# Patient Record
Sex: Female | Born: 1985 | Hispanic: No | Marital: Married | Smoking: Never smoker
Health system: Southern US, Community
[De-identification: ages and names within clinical notes are randomized; demographics above are authoritative.]

## PROBLEM LIST (undated history)

## (undated) ENCOUNTER — Inpatient Hospital Stay (HOSPITAL_COMMUNITY): Payer: Self-pay

## (undated) DIAGNOSIS — T7840XA Allergy, unspecified, initial encounter: Secondary | ICD-10-CM

## (undated) DIAGNOSIS — R531 Weakness: Secondary | ICD-10-CM

## (undated) DIAGNOSIS — D649 Anemia, unspecified: Secondary | ICD-10-CM

## (undated) DIAGNOSIS — R87619 Unspecified abnormal cytological findings in specimens from cervix uteri: Secondary | ICD-10-CM

## (undated) HISTORY — DX: Weakness: R53.1

## (undated) HISTORY — DX: Unspecified abnormal cytological findings in specimens from cervix uteri: R87.619

## (undated) HISTORY — PX: NO PAST SURGERIES: SHX2092

## (undated) HISTORY — DX: Anemia, unspecified: D64.9

## (undated) HISTORY — DX: Allergy, unspecified, initial encounter: T78.40XA

---

## 2010-06-27 ENCOUNTER — Encounter: Payer: Self-pay | Admitting: Family Medicine

## 2010-06-27 ENCOUNTER — Ambulatory Visit (INDEPENDENT_AMBULATORY_CARE_PROVIDER_SITE_OTHER): Payer: BC Managed Care – PPO | Admitting: Family Medicine

## 2010-06-27 DIAGNOSIS — R51 Headache: Secondary | ICD-10-CM

## 2010-06-27 DIAGNOSIS — R5383 Other fatigue: Secondary | ICD-10-CM

## 2010-06-27 DIAGNOSIS — Z111 Encounter for screening for respiratory tuberculosis: Secondary | ICD-10-CM

## 2010-06-27 DIAGNOSIS — R5381 Other malaise: Secondary | ICD-10-CM

## 2010-06-27 DIAGNOSIS — R0602 Shortness of breath: Secondary | ICD-10-CM

## 2010-06-27 NOTE — Progress Notes (Signed)
Subjective:    Patient ID: Lindsey Fuller, female    DOB: 06/24/1984, 25 y.o.   MRN: [id number]  HPI  Had HA in the back of her head yesterday. Normally doesn't have HA.  BP as fine yesterday. Was resting when the HA started but she says it was severe.  Felt really weak.  Husband says she looked pale.  Occ has SOB. Hx  of anemia.  Feels like she might faint. SHe does eat meat.  No blood in the urine  Or stool.  Had similar HA about 2 years ago.  No nausea. No light or sounds sensitivity. Skip meals occasionally. Mostly eats junk food.  Feels SOB sometimes.  No cough, fever, or URI sxs. Has felt this way for months. She has not filled hte hydrocodone she was given in the ED.   Review of Systems  Constitutional: Negative for fever, diaphoresis and unexpected weight change.  HENT: Negative for hearing loss, rhinorrhea and tinnitus.   Eyes: Negative for visual disturbance.  Respiratory: Negative for cough and wheezing.   Cardiovascular: Negative for chest pain and palpitations.  Gastrointestinal: Negative for nausea, vomiting, diarrhea and blood in stool.  Genitourinary: Negative for vaginal bleeding, vaginal discharge and difficulty urinating.  Musculoskeletal: Negative for myalgias and arthralgias.  Skin: Negative for rash.  Neurological: Positive for headaches.  Hematological: Negative for adenopathy. Does not bruise/bleed easily.  Psychiatric/Behavioral: Negative for sleep disturbance and dysphoric mood. The patient is not nervous/anxious.    BP 114/73  Pulse 65  Resp 20  Ht 5\' 6"  (1.676 m)  Wt 115 lb (52.164 kg)  BMI 18.56 kg/m2  SpO2 100%  LMP 06/01/2010    Allergies  Allergen Reactions  . Asa Buff (Mag (Aspirin Buffered) Rash    Past Medical History  Diagnosis Date  . Chronic headaches     2 episodes ever  . Weakness     No past surgical history on file.  History   Social History  . Marital Status: Married    Spouse Name: Masiah Lewing    Number of Children: N/A    . Years of Education: BA   Occupational History  . Student    Social History Main Topics  . Smoking status: Never Smoker   . Smokeless tobacco: Not on file  . Alcohol Use: No  . Drug Use: No  . Sexually Active: Yes -- Female partner(s)     runs, goes to the gymn 3 X week, student BS degree   Other Topics Concern  . Not on file   Social History Narrative   2 caffeinated drinks daily.  Exercises regulalry.      No family history on file.  Current outpatient prescriptions:HYDROcodone-acetaminophen (VICODIN) 5-500 MG per tablet, Take 1 tablet by mouth every 6 (six) hours as needed.  , Disp: , Rfl:      Objective:   Physical Exam  Constitutional: She is oriented to person, place, and time. She appears well-developed and well-nourished.  HENT:  Head: Normocephalic and atraumatic.  Right Ear: External ear normal.  Left Ear: External ear normal.  Nose: Nose normal.  Mouth/Throat: Oropharynx is clear and moist.       TMs and canals are clear.   Eyes: Conjunctivae are normal. Pupils are equal, round, and reactive to light.  Neck: Neck supple. No thyromegaly present.  Cardiovascular: Normal rate, regular rhythm and normal heart sounds.   Pulmonary/Chest: Effort normal and breath sounds normal.  Musculoskeletal: She exhibits no edema.  Lymphadenopathy:  She has no cervical adenopathy.  Neurological: She is alert and oriented to person, place, and time. She has normal reflexes. She displays normal reflexes. No cranial nerve deficit. She exhibits normal muscle tone. Coordination normal.       CN 2-12 intact  Skin: Skin is warm and dry.  Psychiatric: She has a normal mood and affect.          Assessment & Plan:  Headache-  She is better today. Discussed eating more regularly and staying hydrated. Also will check labs to eval for anemia since has been anemic in the past.  She does drink daily caffeine and I explained that this can cause HA as well.  I suspect her SOB is likely  from anemia. She has no assoc cough or URI sxs. Will ger records from the ED as well.   She also needs a TB skin test but she has had the BCG vaccine series. She will likely be + and will need a CXR.  I explained this to her and her husband.

## 2010-06-30 ENCOUNTER — Ambulatory Visit: Payer: BC Managed Care – PPO

## 2010-06-30 LAB — CBC WITH DIFFERENTIAL/PLATELET
HCT: 36.7 % (ref 36.0–46.0)
Hemoglobin: 12 g/dL (ref 12.0–15.0)
Lymphocytes Relative: 36 % (ref 12–46)
MCHC: 32.7 g/dL (ref 30.0–36.0)
Monocytes Absolute: 0.4 10*3/uL (ref 0.1–1.0)
Monocytes Relative: 7 % (ref 3–12)
Neutro Abs: 2.9 10*3/uL (ref 1.7–7.7)
WBC: 5.6 10*3/uL (ref 4.0–10.5)

## 2010-06-30 LAB — COMPLETE METABOLIC PANEL WITH GFR
Albumin: 4.1 g/dL (ref 3.5–5.2)
CO2: 23 mEq/L (ref 19–32)
GFR, Est African American: >60 mL/min (ref >60)
GFR, Est Non African American: >60 mL/min (ref >60)
Glucose, Bld: 81 mg/dL (ref 70–99)
Potassium: 3.8 mEq/L (ref 3.5–5.3)
Sodium: 138 mEq/L (ref 135–145)
Total Protein: 6.8 g/dL (ref 6.0–8.3)

## 2010-07-01 ENCOUNTER — Telehealth: Payer: Self-pay | Admitting: Family Medicine

## 2010-07-01 NOTE — Telephone Encounter (Signed)
Call pt; Blood count, metabolic panel and thyroid are normal. No anemia. I do recommend more healthy diet and even consider a MVI.  Also I don't have her TB test results back today.

## 2010-07-01 NOTE — Telephone Encounter (Signed)
Pt advised of test results. Will call and sched her TB test to be placed tomr.

## 2010-07-02 ENCOUNTER — Ambulatory Visit (INDEPENDENT_AMBULATORY_CARE_PROVIDER_SITE_OTHER): Payer: BC Managed Care – PPO | Admitting: Family Medicine

## 2010-07-02 DIAGNOSIS — Z23 Encounter for immunization: Secondary | ICD-10-CM

## 2010-07-04 ENCOUNTER — Telehealth: Payer: Self-pay | Admitting: *Deleted

## 2010-07-04 NOTE — Telephone Encounter (Signed)
TB skin test neg 0mm duration

## 2012-01-22 ENCOUNTER — Ambulatory Visit (INDEPENDENT_AMBULATORY_CARE_PROVIDER_SITE_OTHER): Payer: BC Managed Care – PPO | Admitting: Physician Assistant

## 2012-01-22 ENCOUNTER — Encounter: Payer: Self-pay | Admitting: Physician Assistant

## 2012-01-22 VITALS — BP 97/51 | HR 53 | Ht 66.0 in | Wt 129.0 lb

## 2012-01-22 DIAGNOSIS — Z0000 Encounter for general adult medical examination without abnormal findings: Secondary | ICD-10-CM

## 2012-01-22 DIAGNOSIS — R5381 Other malaise: Secondary | ICD-10-CM

## 2012-01-22 DIAGNOSIS — Z13220 Encounter for screening for lipoid disorders: Secondary | ICD-10-CM

## 2012-01-22 DIAGNOSIS — Z131 Encounter for screening for diabetes mellitus: Secondary | ICD-10-CM

## 2012-01-22 DIAGNOSIS — R5383 Other fatigue: Secondary | ICD-10-CM

## 2012-01-22 NOTE — Patient Instructions (Addendum)
Make sure you are eating 4 servings of dairy a day.   Need to get pap smear.   HPV Vaccine Questions and Answers WHAT IS HUMAN PAPILLOMAVIRUS (HPV)? HPV is a virus that can lead to cervical cancer; vulvar and vaginal cancers; penile cancer; anal cancer and genital warts (warts in the genital areas). More than 1 vaccine is available to help you or your child with protection against HPV. Your caregiver can talk to you about which one might give you the best protection. WHO SHOULD GET THIS VACCINE? The HPV vaccine is most effective when given before the onset of sexual activity.  This vaccine is recommended for girls 71 or 25 years of age. It can be given to girls as young as 25 years old.  HPV vaccine can be given to males, 9 through 25 years of age, to reduce the likelihood of acquiring genital warts.  HPV vaccine can be given to males and females aged 30 through 26 years to prevent anal cancer. HPV vaccine is not generally recommended after age 25, because most individuals have been exposed to the HPV virus by that age. HOW EFFECTIVE IS THIS VACCINE?  The vaccine is generally effective in preventing cervical; vulvar and vaginal cancers; penile cancer; anal cancer and genital warts caused by 4 types of HPV. The vaccine is less effective in those individuals who are already infected with HPV. This vaccine does not treat existing HPV, genital warts, pre-cancers or cancers. WILL SEXUALLY ACTIVE INDIVIDUALS BENEFIT FROM THE VACCINE? Sexually active individuals may still benefit from the vaccine but may get less benefit due to previous HPV exposure. HOW AND WHEN IS THE VACCINE ADMINISTERED? The vaccine is given in a series of 3 injections (shots) over a 6 month period in both males and females. The exact timing depends on which specific vaccine your caregiver recommends for you. IS THE HPV VACCINE SAFE?  The federal government has approved the HPV vaccine as safe and effective. This vaccine was  tested in both males and females in many countries around the world. The most common side effect is soreness at the injection site. Since the drug became approved, there has been some concern about patients passing out after being vaccinated, which has led to a recommendation of a 15 minute waiting period following vaccination. This practice may decrease the small risk of passing out. Additionally there is a rare risk of anaphylaxis (an allergic reaction) to the vaccine and a risk of a blood clot among individuals with specific risk factors for a blood clot. DOES THIS VACCINE CONTAIN THIMEROSAL OR MERCURY? No. There is no thimerosal or mercury in the HPV vaccine. It is made of proteins from the outer coat of the virus (HPV). There is no infectious material in this vaccine. WILL GIRLS/WOMEN WHO HAVE BEEN VACCINATED STILL NEED CERVICAL CANCER SCREENING? Yes. There are 3 reasons why women will still need regular cervical cancer screening. First, the vaccine will NOT provide protection against all types of HPV that cause cervical cancer. Vaccinated women will still be at risk for some cancers. Second, some women may not get all required doses of the vaccine (or they may not get them at the recommended times). Therefore, they may not get the vaccine's full benefits. Third, women may not get the full benefit of the vaccine if they receive it after they have already acquired any of the 4 types of HPV. WILL THE HPV VACCINE BE COVERED BY INSURANCE PLANS? While some insurance companies may cover the  vaccine, others may not. Most large group insurance plans cover the costs of recommended vaccines. WHAT KIND OF GOVERNMENT PROGRAMS MAY BE AVAILABLE TO COVER HPV VACCINE? Federal health programs such as Vaccines for Children [facility]) will cover the HPV vaccine. The [facility] program provides free vaccines to children and adolescents under 89 years of age, who are either uninsured, Medicaid-eligible, American [place] or [place]  Native. There are over 45,000 sites that provide [facility] vaccines including hospital, private and public clinics. The [facility] program also allows children and adolescents to get VFC vaccines through [facility] or [facility] if their private health insurance does not cover the vaccine. Some states also provide free or low-cost vaccines, at public health clinics, to people without health insurance coverage for vaccines. GENITAL HPV: WHY IS HPV IMPORTANT? Genital HPV is the most common virus transmitted through genital contact, most often during vaginal and anal sex. About 40 types of HPV can infect the genital areas of men and women. While most HPV types cause no symptoms and go away on their own, some types can cause cervical cancer in women. These types also cause other less common genital cancers, including cancers of the penis, anus, vagina (birth canal), and vulva (area around the opening of the vagina). Other types of HPV can cause genital warts in men and women. HOW COMMON IS HPV?   At least 50% of sexually active people will get HPV at some time in their lives. HPV is most common in young women and men who are in their late teens and early 92s.  Anyone who has ever had genital contact with another person can get HPV. Both men and women can get it and pass it on to their sex partners without realizing it. IS HPV THE SAME THING AS HIV OR HERPES? HPV is NOT the same as HIV or Herpes (Herpes simplex virus or HSV). While these are all viruses that can be sexually transmitted, HIV and HSV do not cause the same symptoms or health problems as HPV. CAN HPV AND ITS ASSOCIATED DISEASES BE TREATED? There is no treatment for HPV. There are treatments for the health problems that HPV can cause, such as genital warts, cervical cell changes, and cancers of the cervix (lower part of the womb), vulva, vagina and anus.  HOW IS HPV RELATED TO CERVICAL CANCER? Some types of HPV can infect a  woman's cervix and cause the cells to change in an abnormal way. Most of the time, HPV goes away on its own. When HPV is gone, the cervical cells go back to normal. Sometimes, HPV does not go away. Instead, it lingers (persists) and continues to change the cells on a woman's cervix. These cell changes can lead to cancer over time if they are not treated. ARE THERE OTHER WAYS TO PREVENT CERVICAL CANCER? Regular Pap tests and follow-up can prevent most, but not all, cases of cervical cancer. Pap tests can detect cell changes (or pre-cancers) in the cervix before they turn into cancer. Pap tests can also detect most, but not all, cervical cancers at an early, curable stage. Most women diagnosed with cervical cancer have either never had a Pap test, or not had a Pap test in the last 5 years. There is also an HPV DNA test available for use with the Pap test as part of cervical cancer screening. This test may be ordered for women over 30 or for women who get an unclear (borderline) Pap test result. While this  test can tell if a woman has HPV on her cervix, it cannot tell which types of HPV she has. If the HPV DNA test is negative for HPV DNA, then screening may be done every 3 years. If the HPV DNA test is positive for HPV DNA, then screening should be done every 6 to 12 months. OTHER QUESTIONS ABOUT THE HPV VACCINE WHAT HPV TYPES DOES THE VACCINE PROTECT AGAINST? The HPV vaccine protects against the HPV types that cause most (70%) cervical cancers (types 16 and 18), most (78%) anal cancers (types 16 and 18) and the two HPV types that cause most (90%) genital warts (types 6 and 11). WHAT DOES THE VACCINE NOT PROTECT AGAINST?  Because the vaccine does not protect against all types of HPV, it will not prevent all cases of cervical cancer, anal cancer, other genital cancers or genital warts. About 30% of cervical cancers are not prevented with vaccination, so it will be important for women to continue screening for  cervical cancer (regular Pap tests). Also, the vaccine does not prevent about 10% of genital warts nor will it prevent other sexually transmitted infections (STIs), including HIV. Therefore, it will still be important for sexually active adults to practice safe sex to reduce exposure to HPV and other STI's. HOW LONG DOES VACCINE PROTECTION LAST? WILL A BOOSTER SHOT BE NEEDED? So far, studies have followed women for 5 years and found that they are still protected. Currently, additional (booster) doses are not recommended. More research is being done to find out how long protection will last, and if a booster vaccine is needed years later.  WHY IS THE HPV VACCINE RECOMMENDED AT SUCH A YOUNG AGE? Ideally, males and females should get the vaccine before they are sexually active since this vaccine is most effective in individuals who have not yet acquired any of the HPV vaccine types. Individuals who have not been infected with any of the 4 types of HPV will get the full benefits of the vaccine.  SHOULD PREGNANT WOMEN BE VACCINATED? The vaccine is not recommended for pregnant women. There has been limited research looking at vaccine safety for pregnant women and their developing fetus. Studies suggest that the vaccine has not caused health problems during pregnancy, nor has it caused health problems for the infant. Pregnant women should complete their pregnancy before getting the vaccine. If a woman finds out she is pregnant after she has started getting the vaccine series, she should complete her pregnancy before finishing the 3 doses. SHOULD BREASTFEEDING MOTHERS BE VACCINATED? Mothers nursing their babies may get the vaccine because the virus is inactivated and will not harm the mother or baby. WILL INDIVIDUALS BE PROTECTED AGAINST HPV AND RELATED DISEASES, EVEN IF THEY DO NOT GET ALL 3 DOSES? It is not yet known how much protection individuals will get from receiving only 1 or 2 doses of the vaccine. For  this reason, it is very important that individuals get all 3 doses of the vaccine. WILL CHILDREN BE REQUIRED TO BE VACCINATED TO ENTER SCHOOL? There are no federal laws that require children or adolescents to get vaccinated. All school entry laws are state laws so they vary from state to state. To find out what vaccines are needed for children or adolescents to enter school in your state, check with your state health department or board of education. ARE THERE OTHER WAYS TO PREVENT HPV? The only sure way to prevent HPV is to abstain from all sexual activity. Sexually active adults  can reduce their risk by being in a mutually monogamous relationship with someone who has had no other sex partners. But even individuals with only 1 lifetime sex partner can get HPV, if their partner has had a previous partner with HPV. It is unknown how much protection condoms provide against HPV, since areas that are not covered by a condom can be exposed to the virus. However, condoms may reduce the risk of genital warts and cervical cancer. They can also reduce the risk of HIV and some other sexually transmitted infections (STIs), when used consistently and correctly (all the time and the right way). Document Released: 12/29/2004 Document Revised: 03/23/2011 Document Reviewed: 08/24/2008 [facility] Patient Information 2013 [place], [place].

## 2012-01-22 NOTE — Progress Notes (Signed)
Subjective:     Lindsey Fuller is a 27 y.o. female and is here for a comprehensive physical exam. The patient reports problems - Patient does report some fatigue. She met it is mostly around her menstrual cycle. Patient does exercise regularly and tries to maintain a healthy diet.. Patient reports that she would like labs to make sure there is no reason for her having fatigue. She does confirm that her periods are regular and her flow is average. Patient has never had a Pap smear  History   Social History  . Marital Status: Married    Spouse Name: Joleah Kosak    Number of Children: N/A  . Years of Education: BA   Occupational History  . Student    Social History Main Topics  . Smoking status: Never Smoker   . Smokeless tobacco: Not on file  . Alcohol Use: No  . Drug Use: No  . Sexually Active: Yes -- Female partner(s)     Comment: runs, goes to the gymn 3 X week, student BS degree   Other Topics Concern  . Not on file   Social History Narrative   2 caffeinated drinks daily.  Exercises regulalry.     Health Maintenance  Topic Date Due  . Pap Smear  06/23/2005  . Tetanus/tdap  06/24/2006  . Influenza Vaccine  09/13/2011    The following portions of the patient's history were reviewed and updated as appropriate: allergies, current medications, past family history, past medical history, past social history, past surgical history and problem list.  Review of Systems A comprehensive review of systems was negative.   Objective:    BP 97/51  Pulse 53  Ht 5\' 6"  (1.676 m)  Wt 129 lb (58.514 kg)  BMI 20.82 kg/m2  LMP 12/25/2011 General appearance: alert, cooperative and appears stated age Head: Normocephalic, without obvious abnormality, atraumatic Eyes: conjunctivae/corneas clear. PERRL, EOM's intact. Fundi benign. Ears: normal TM's and external ear canals both ears Nose: Nares normal. Septum midline. Mucosa normal. No drainage or sinus tenderness. Throat: lips, mucosa, and  tongue normal; teeth and gums normal Neck: no adenopathy, no carotid bruit, no JVD, supple, symmetrical, trachea midline and thyroid not enlarged, symmetric, no tenderness/mass/nodules Back: symmetric, no curvature. ROM normal. No CVA tenderness. Lungs: clear to auscultation bilaterally Breasts: normal appearance, no masses or tenderness Heart: regular rate and rhythm, S1, S2 normal, no murmur, click, rub or gallop Abdomen: soft, non-tender; bowel sounds normal; no masses,  no organomegaly Extremities: extremities normal, atraumatic, no cyanosis or edema Pulses: 2+ and symmetric Skin: Skin color, texture, turgor normal. No rashes or lesions Lymph nodes: Cervical, supraclavicular, and axillary nodes normal. Neurologic: Grossly normal    Assessment:    Healthy female exam.      Plan:     CPE/fatigue-we'll check lipids, CMP, TSH, vitamin D, vitamin B12, CBC. This would look for any metabolic causes of fatigue. Will call patient with results. Encouraged patient to continue regular exercise and a healthy daily lifestyle. Discuss starting a multivitamin that has calcium in it. A multivitamin along with 2 servings of calcium today with the great way to build bone help. Patient declined flu shot today. She also does not had any documented Tdap today. She is going to review her records and see if she can find when her last tdap was and if she needs one she will call office. I did discuss with patient the need for a Pap smear. She is aware and states that she will  either schedule with OB or this office. See After Visit Summary for Counseling Recommendations

## 2012-01-28 LAB — LIPID PANEL
Cholesterol: 128 mg/dL (ref 0–200)
HDL: 47 mg/dL
LDL Cholesterol: 70 mg/dL (ref 0–99)
Total CHOL/HDL Ratio: 2.7 ratio
Triglycerides: 54 mg/dL
VLDL: 11 mg/dL (ref 0–40)

## 2012-01-28 LAB — COMPREHENSIVE METABOLIC PANEL WITH GFR
ALT: 11 U/L (ref 0–35)
AST: 17 U/L (ref 0–37)
Albumin: 3.8 g/dL (ref 3.5–5.2)
Alkaline Phosphatase: 33 U/L — ABNORMAL LOW (ref 39–117)
BUN: 9 mg/dL (ref 6–23)
CO2: 27 meq/L (ref 19–32)
Calcium: 8.8 mg/dL (ref 8.4–10.5)
Chloride: 107 meq/L (ref 96–112)
Creat: 0.48 mg/dL — ABNORMAL LOW (ref 0.50–1.10)
Glucose, Bld: 90 mg/dL (ref 70–99)
Potassium: 3.7 meq/L (ref 3.5–5.3)
Sodium: 139 meq/L (ref 135–145)
Total Bilirubin: 0.5 mg/dL (ref 0.3–1.2)
Total Protein: 6.5 g/dL (ref 6.0–8.3)

## 2012-01-28 LAB — CBC WITH DIFFERENTIAL/PLATELET
Basophils Relative: 1 % (ref 0–1)
Eosinophils Absolute: 0.3 10*3/uL (ref 0.0–0.7)
Eosinophils Relative: 7 % — ABNORMAL HIGH (ref 0–5)
Hemoglobin: 11.7 g/dL — ABNORMAL LOW (ref 12.0–15.0)
MCH: 28 pg (ref 26.0–34.0)
MCHC: 32.8 g/dL (ref 30.0–36.0)
MCV: 85.4 fL (ref 78.0–100.0)
Monocytes Absolute: 0.4 10*3/uL (ref 0.1–1.0)
Monocytes Relative: 8 % (ref 3–12)
Neutrophils Relative %: 49 % (ref 43–77)

## 2012-01-28 LAB — VITAMIN B12: Vitamin B-12: 617 pg/mL (ref 211–911)

## 2012-01-29 ENCOUNTER — Telehealth: Payer: Self-pay | Admitting: Physician Assistant

## 2012-01-29 LAB — VITAMIN D 25 HYDROXY (VIT D DEFICIENCY, FRACTURES): Vit D, 25-Hydroxy: 33 ng/mL (ref 30–89)

## 2012-01-29 NOTE — Telephone Encounter (Signed)
Pt.notified

## 2012-01-29 NOTE — Telephone Encounter (Signed)
Call pt: Received records she did have Tetanus in 2007. She does need a Tdap booster that has pertussis vaccine in it. She could come in anytime to get booster.

## 2012-04-06 ENCOUNTER — Ambulatory Visit (INDEPENDENT_AMBULATORY_CARE_PROVIDER_SITE_OTHER): Payer: BC Managed Care – PPO | Admitting: Physician Assistant

## 2012-04-06 ENCOUNTER — Encounter: Payer: Self-pay | Admitting: Physician Assistant

## 2012-04-06 VITALS — BP 119/73 | HR 111 | Wt 128.0 lb

## 2012-04-06 DIAGNOSIS — R0981 Nasal congestion: Secondary | ICD-10-CM | POA: Insufficient documentation

## 2012-04-06 DIAGNOSIS — J302 Other seasonal allergic rhinitis: Secondary | ICD-10-CM | POA: Insufficient documentation

## 2012-04-06 DIAGNOSIS — R05 Cough: Secondary | ICD-10-CM

## 2012-04-06 DIAGNOSIS — R0602 Shortness of breath: Secondary | ICD-10-CM

## 2012-04-06 DIAGNOSIS — J3489 Other specified disorders of nose and nasal sinuses: Secondary | ICD-10-CM

## 2012-04-06 DIAGNOSIS — R059 Cough, unspecified: Secondary | ICD-10-CM

## 2012-04-06 DIAGNOSIS — H04209 Unspecified epiphora, unspecified lacrimal gland: Secondary | ICD-10-CM

## 2012-04-06 DIAGNOSIS — J309 Allergic rhinitis, unspecified: Secondary | ICD-10-CM

## 2012-04-06 MED ORDER — FLUTICASONE PROPIONATE 50 MCG/ACT NA SUSP: 2.0000 | Freq: Every day | NASAL | Status: DC

## 2012-04-06 MED ORDER — ALBUTEROL SULFATE HFA 108 (90 BASE) MCG/ACT IN AERS: 2.0000 | INHALATION_SPRAY | Freq: Four times a day (QID) | RESPIRATORY_TRACT | Status: DC | PRN

## 2012-04-06 MED ORDER — METHYLPREDNISOLONE SODIUM SUCC 125 MG IJ SOLR
125.0000 mg | Freq: Once | INTRAMUSCULAR | Status: AC
  Administered 2012-04-06: 125 mg via INTRAMUSCULAR

## 2012-04-06 NOTE — Patient Instructions (Addendum)
Start flonase 2 sprays daily can decrease to 1 spray when controlled. Start allegra/zyrtec daily.  Can use sudafed/decongestant for 3 days ish when things get worse.   Sending albuterol to use as needed if start coughing/SOB.   Will refer to allergist.

## 2012-04-06 NOTE — Progress Notes (Signed)
  Subjective:    Patient ID: Lindsey Fuller, female    DOB: 1985-04-01, 27 y.o.   MRN: [id number]  HPI Patient presents to the clinic with trouble breathing. Denies wheezing or history of asthma. She does have seasonal allergies and has not been taking any allergy medication. For last 3 days have been very congested and feels like she cannot take a deep breath. She previously had allergy shots where she lived before because her allergies where so bad. She denies any CP but has had some chest tightness.she has had a dry cough at night and when she attemps to exert herself. Symptoms are worse at night. Denies any fever, chills, ST, ear pain or sinus pressure. Her eyes are very watery.     Review of Systems     Objective:   Physical Exam  Constitutional: She appears well-developed and well-nourished.  HENT:  Head: Normocephalic and atraumatic.  Right Ear: External ear normal.  Left Ear: External ear normal.  Mouth/Throat: Oropharynx is clear and moist. No oropharyngeal exudate.  TM's clear.   Turbinates red and swollen.  Eyes:  Bilateral conjunctivia injected with watery discharge.  Neck: Normal range of motion. Neck supple.  Cardiovascular: Regular rhythm and normal heart sounds.   Tachycardia at 111.  Pulmonary/Chest: Effort normal and breath sounds normal. She has no wheezes.  Lymphadenopathy:    She has no cervical adenopathy.  Skin: Skin is warm and dry.  Psychiatric: She has a normal mood and affect. Her behavior is normal.          Assessment & Plan:  Seasonal allegies/nasal congestion/allergic conjunctivits/cough/chest tightness- peak flow done and in the green. Suspect allergies as the culprit. Shot of solumedrol was given 125mg  today.Sent flonase to start daily along with daily anti-histamine added back to regimen. Did also give albuterol to use at bedtime/SOB/Cough or when peak flow gets' in yellow range. How to use inhaler was discussed. Will also send to allergist to  see if candidate for shots again. Use OTC allergic eye drops and call if not improving. If noticing albuterol is helping and using daily then need to follow up to discuss other treatment. Use decongestant for a couple of days if need it in between daily anti-histamine.

## 2012-12-14 ENCOUNTER — Ambulatory Visit (INDEPENDENT_AMBULATORY_CARE_PROVIDER_SITE_OTHER): Payer: BC Managed Care – PPO | Admitting: Obstetrics & Gynecology

## 2012-12-14 ENCOUNTER — Encounter: Payer: Self-pay | Admitting: Obstetrics & Gynecology

## 2012-12-14 VITALS — BP 128/73 | HR 99 | Resp 16 | Ht 66.0 in | Wt 128.0 lb

## 2012-12-14 DIAGNOSIS — Z124 Encounter for screening for malignant neoplasm of cervix: Secondary | ICD-10-CM

## 2012-12-14 DIAGNOSIS — Z113 Encounter for screening for infections with a predominantly sexual mode of transmission: Secondary | ICD-10-CM

## 2012-12-14 DIAGNOSIS — Z0000 Encounter for general adult medical examination without abnormal findings: Secondary | ICD-10-CM

## 2012-12-14 DIAGNOSIS — Z01419 Encounter for gynecological examination (general) (routine) without abnormal findings: Secondary | ICD-10-CM

## 2012-12-14 DIAGNOSIS — Z1151 Encounter for screening for human papillomavirus (HPV): Secondary | ICD-10-CM

## 2012-12-14 NOTE — Progress Notes (Signed)
Subjective:    Lindsey Fuller is a 27 y.o. female who presents for an annual exam. The patient has no complaints today.She does have heavyish periods.  The patient is sexually active. GYN screening history: no prior history of gyn screening tests. The patient wears seatbelts: yes. The patient participates in regular exercise: yes. (treadmill)  Has the patient ever been transfused or tattooed?: no. The patient reports that there is not domestic violence in her life.   Menstrual History: OB History   Grav Para Term Preterm Abortions TAB SAB Ect Mult Living   0 0 0 0 0 0 0 0 0 0       Menarche age: 74 Coitarche: 51   Patient's last menstrual period was 11/20/2012.    The following portions of the patient's history were reviewed and updated as appropriate: allergies, current medications, past family history, past medical history, past social history, past surgical history and problem list.  Review of Systems A comprehensive review of systems was negative.  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)  Married since 2011, husband is 39 yo Using condoms   Objective:    BP 128/73  Pulse 99  Resp 16  Ht 5\' 6"  (1.676 m)  Wt 128 lb (58.06 kg)  BMI 20.67 kg/m2  LMP 11/20/2012  General Appearance:    Alert, cooperative, no distress, appears stated age  Head:    Normocephalic, without obvious abnormality, atraumatic  Eyes:    PERRL, conjunctiva/corneas clear, EOM's intact, fundi    benign, both eyes  Ears:    Normal TM's and external ear canals, both ears  Nose:   Nares normal, septum midline, mucosa normal, no drainage    or sinus tenderness  Throat:   Lips, mucosa, and tongue normal; teeth and gums normal  Neck:   Supple, symmetrical, trachea midline, no adenopathy;    thyroid:  no enlargement/tenderness/nodules; no carotid   bruit or JVD  Back:     Symmetric, no curvature, ROM normal, no CVA tenderness   Lungs:     Clear to auscultation bilaterally, respirations unlabored  Chest Wall:    No tenderness or deformity   Heart:    Regular rate and rhythm, S1 and S2 normal, no murmur, rub   or gallop  Breast Exam:    No tenderness, masses, or nipple abnormality  Abdomen:     Soft, non-tender, bowel sounds active all four quadrants,    no masses, no organomegaly  Genitalia:    Normal female without lesion, discharge or tenderness, NSSR, minimal mobility, normal adnexal exam     Extremities:   Extremities normal, atraumatic, no cyanosis or edema  Pulses:   2+ and symmetric all extremities  Skin:   Skin color, texture, turgor normal, no rashes or lesions  Lymph nodes:   Cervical, supraclavicular, and axillary nodes normal  Neurologic:   CNII-XII intact, normal strength, sensation and reflexes    throughout  .    Assessment:    Healthy female exam.    Plan:     Breast self exam technique reviewed and patient encouraged to perform self-exam monthly. Chlamydia specimen. GC specimen. Thin prep Pap smear.  I recommended Gardasil and gave her a handout

## 2012-12-21 ENCOUNTER — Telehealth: Payer: Self-pay | Admitting: *Deleted

## 2012-12-21 ENCOUNTER — Encounter: Payer: Self-pay | Admitting: *Deleted

## 2012-12-21 NOTE — Telephone Encounter (Signed)
Pt notified of ASCUS pap with HPV.  She is scheduled for Colposcopy 12/28/12 with Dr Marice Potter.

## 2012-12-28 ENCOUNTER — Encounter: Payer: Self-pay | Admitting: Obstetrics & Gynecology

## 2012-12-28 ENCOUNTER — Ambulatory Visit (INDEPENDENT_AMBULATORY_CARE_PROVIDER_SITE_OTHER): Payer: BC Managed Care – PPO | Admitting: Obstetrics & Gynecology

## 2012-12-28 VITALS — BP 118/69 | HR 80 | Resp 16 | Ht 67.0 in | Wt 131.0 lb

## 2012-12-28 DIAGNOSIS — IMO0002 Reserved for concepts with insufficient information to code with codable children: Secondary | ICD-10-CM

## 2012-12-28 DIAGNOSIS — Z01812 Encounter for preprocedural laboratory examination: Secondary | ICD-10-CM

## 2012-12-28 DIAGNOSIS — R87610 Atypical squamous cells of undetermined significance on cytologic smear of cervix (ASC-US): Secondary | ICD-10-CM

## 2012-12-28 DIAGNOSIS — Z23 Encounter for immunization: Secondary | ICD-10-CM

## 2012-12-28 NOTE — Progress Notes (Signed)
   Subjective:    Patient ID: Lindsey Fuller, female    DOB: April 17, 1985, 27 y.o.   MRN: [id number]  HPI 27 yo young lady here today for a colposcopy because of a ASCUS, +HR HPV pap last month. She has decided to start the Gardasil series today.   Review of Systems     Objective:   Physical Exam  UPT negative, consent signed, time out done Cervix prepped with acetic acid. Transformation zone seen in its entirety. Colpo adequate. Changes c/w LGSIL seen 5 o'clock position at the os (acetowhite changes) ECC obtained. She tolerated the procedure well.        Assessment & Plan:  ACUS +HPV pap- await ECC Start Gardasil today RTC 2 months for Gardasil #2

## 2013-01-02 ENCOUNTER — Telehealth: Payer: Self-pay | Admitting: *Deleted

## 2013-01-02 NOTE — Telephone Encounter (Signed)
Pt notified of neg ECC and will repeat pap in 1 year with co-testing.

## 2013-02-28 ENCOUNTER — Ambulatory Visit: Payer: BC Managed Care – PPO

## 2013-03-01 ENCOUNTER — Ambulatory Visit (INDEPENDENT_AMBULATORY_CARE_PROVIDER_SITE_OTHER): Payer: BC Managed Care – PPO | Admitting: *Deleted

## 2013-03-01 DIAGNOSIS — Z23 Encounter for immunization: Secondary | ICD-10-CM

## 2013-06-29 ENCOUNTER — Ambulatory Visit (INDEPENDENT_AMBULATORY_CARE_PROVIDER_SITE_OTHER): Payer: BC Managed Care – PPO | Admitting: *Deleted

## 2013-06-29 DIAGNOSIS — Z3200 Encounter for pregnancy test, result unknown: Secondary | ICD-10-CM

## 2013-06-29 DIAGNOSIS — Z3201 Encounter for pregnancy test, result positive: Secondary | ICD-10-CM

## 2013-06-29 LAB — POCT URINE PREGNANCY: Preg Test, Ur: POSITIVE

## 2013-06-29 NOTE — Progress Notes (Signed)
Pt here for 3rd Gardasil but thinks she maybe pregnant.  LMP 06/02/13 and has had unprotected intercourse.  UPT today is positive.  Gardasil deferred and NOB appt made.  Pt is currently on PNV.

## 2013-07-24 ENCOUNTER — Encounter: Payer: Self-pay | Admitting: Advanced Practice Midwife

## 2013-07-24 ENCOUNTER — Ambulatory Visit (INDEPENDENT_AMBULATORY_CARE_PROVIDER_SITE_OTHER): Payer: BC Managed Care – PPO | Admitting: Advanced Practice Midwife

## 2013-07-24 VITALS — BP 121/68 | HR 76 | Wt 131.0 lb

## 2013-07-24 DIAGNOSIS — R87810 Cervical high risk human papillomavirus (HPV) DNA test positive: Secondary | ICD-10-CM

## 2013-07-24 DIAGNOSIS — Z3491 Encounter for supervision of normal pregnancy, unspecified, first trimester: Secondary | ICD-10-CM | POA: Insufficient documentation

## 2013-07-24 DIAGNOSIS — R87610 Atypical squamous cells of undetermined significance on cytologic smear of cervix (ASC-US): Secondary | ICD-10-CM | POA: Insufficient documentation

## 2013-07-24 DIAGNOSIS — Z3401 Encounter for supervision of normal first pregnancy, first trimester: Secondary | ICD-10-CM

## 2013-07-24 DIAGNOSIS — Z3400 Encounter for supervision of normal first pregnancy, unspecified trimester: Secondary | ICD-10-CM

## 2013-07-24 NOTE — Patient Instructions (Signed)
Tums Pepcid  Pregnancy - First Trimester During sexual intercourse, millions of sperm go into the vagina. Only 1 sperm will penetrate and fertilize the female egg while it is in the Fallopian tube. One week later, the fertilized egg implants into the wall of the uterus. An embryo begins to develop into a baby. At 6 to 8 weeks, the eyes and face are formed and the heartbeat can be seen on ultrasound. At the end of 12 weeks (first trimester), all the baby's organs are formed. Now that you are pregnant, you will want to do everything you can to have a healthy baby. Two of the most important things are to get good prenatal care and follow your caregiver's instructions. Prenatal care is all the medical care you receive before the baby's birth. It is given to prevent, find, and treat problems during the pregnancy and childbirth. PRENATAL EXAMS  During prenatal visits, your weight, blood pressure, and urine are checked. This is done to make sure you are healthy and progressing normally during the pregnancy.  A pregnant woman should gain 25 to 35 pounds during the pregnancy. However, if you are overweight or underweight, your caregiver will advise you regarding your weight.  Your caregiver will ask and answer questions for you.  Blood work, cervical cultures, other necessary tests, and a Pap test are done during your prenatal exams. These tests are done to check on your health and the probable health of your baby. Tests are strongly recommended and done for HIV with your permission. This is the virus that causes AIDS. These tests are done because medicines can be given to help prevent your baby from being born with this infection should you have been infected without knowing it. Blood work is also used to find out your blood type, previous infections, and follow your blood levels (hemoglobin).  Low hemoglobin (anemia) is common during pregnancy. Iron and vitamins are given to help prevent this. Later in the  pregnancy, blood tests for diabetes will be done along with any other tests if any problems develop.  You may need other tests to make sure you and the baby are doing well. CHANGES DURING THE FIRST TRIMESTER  Your body goes through many changes during pregnancy. They vary from person to person. Talk to your caregiver about changes you notice and are concerned about. Changes can include:  Your menstrual period stops.  The egg and sperm carry the genes that determine what you look like. Genes from you and your partner are forming a baby. The female genes determine whether the baby is a boy or a girl.  Your body increases in girth and you may feel bloated.  Feeling sick to your stomach (nauseous) and throwing up (vomiting). If the vomiting is uncontrollable, call your caregiver.  Your breasts will begin to enlarge and become tender.  Your nipples may stick out more and become darker.  The need to urinate more. Painful urination may mean you have a bladder infection.  Tiring easily.  Loss of appetite.  Cravings for certain kinds of food.  At first, you may gain or lose a couple of pounds.  You may have changes in your emotions from day to day (excited to be pregnant or concerned something may go wrong with the pregnancy and baby).  You may have more vivid and strange dreams. HOME CARE INSTRUCTIONS   It is very important to avoid all smoking, alcohol and non-prescribed drugs during your pregnancy. These affect the formation and growth  of the baby. Avoid chemicals while pregnant to ensure the delivery of a healthy infant.  Start your prenatal visits by the 12th week of pregnancy. They are usually scheduled monthly at first, then more often in the last 2 months before delivery. Keep your caregiver's appointments. Follow your caregiver's instructions regarding medicine use, blood and lab tests, exercise, and diet.  During pregnancy, you are providing food for you and your baby. Eat  regular, well-balanced meals. Choose foods such as meat, fish, milk and other low fat dairy products, vegetables, fruits, and whole-grain breads and cereals. Your caregiver will tell you of the ideal weight gain.  You can help morning sickness by keeping soda crackers at the bedside. Eat a couple before arising in the morning. You may want to use the crackers without salt on them.  Eating 4 to 5 small meals rather than 3 large meals a day also may help the nausea and vomiting.  Drinking liquids between meals instead of during meals also seems to help nausea and vomiting.  A physical sexual relationship may be continued throughout pregnancy if there are no other problems. Problems may be early (premature) leaking of amniotic fluid from the membranes, vaginal bleeding, or belly (abdominal) pain.  Exercise regularly if there are no restrictions. Check with your caregiver or physical therapist if you are unsure of the safety of some of your exercises. Greater weight gain will occur in the last 2 trimesters of pregnancy. Exercising will help:  Control your weight.  Keep you in shape.  Prepare you for labor and delivery.  Help you lose your pregnancy weight after you deliver your baby.  Wear a good support or jogging bra for breast tenderness during pregnancy. This may help if worn during sleep too.  Ask when prenatal classes are available. Begin classes when they are offered.  Do not use hot tubs, steam rooms, or saunas.  Wear your seat belt when driving. This protects you and your baby if you are in an accident.  Avoid raw meat, uncooked cheese, cat litter boxes, and soil used by cats throughout the pregnancy. These carry germs that can cause birth defects in the baby.  The first trimester is a good time to visit your dentist for your dental health. Getting your teeth cleaned is okay. Use a softer toothbrush and brush gently during pregnancy.  Ask for help if you have financial,  counseling, or nutritional needs during pregnancy. Your caregiver will be able to offer counseling for these needs as well as refer you for other special needs.  Do not take any medicines or herbs unless told by your caregiver.  Inform your caregiver if there is any mental or physical domestic violence.  Make a list of emergency phone numbers of family, friends, hospital, and police and fire departments.  Write down your questions. Take them to your prenatal visit.  Do not douche.  Do not cross your legs.  If you have to stand for long periods of time, rotate you feet or take small steps in a circle.  You may have more vaginal secretions that may require a sanitary pad. Do not use tampons or scented sanitary pads. MEDICINES AND DRUG USE IN PREGNANCY  Take prenatal vitamins as directed. The vitamin should contain 1 milligram of folic acid. Keep all vitamins out of reach of children. Only a couple vitamins or tablets containing iron may be fatal to a baby or young child when ingested.  Avoid use of all medicines, including herbs,  over-the-counter medicines, not prescribed or suggested by your caregiver. Only take over-the-counter or prescription medicines for pain, discomfort, or fever as directed by your caregiver. Do not use aspirin, ibuprofen, or naproxen unless directed by your caregiver.  Let your caregiver also know about herbs you may be using.  Alcohol is related to a number of birth defects. This includes fetal alcohol syndrome. All alcohol, in any form, should be avoided completely. Smoking will cause low birth rate and premature babies.  Street or illegal drugs are very harmful to the baby. They are absolutely forbidden. A baby born to an addicted mother will be addicted at birth. The baby will go through the same withdrawal an adult does.  Let your caregiver know about any medicines that you have to take and for what reason you take them. SEEK MEDICAL CARE IF:  You have any  concerns or worries during your pregnancy. It is better to call with your questions if you feel they cannot wait, rather than worry about them. SEEK IMMEDIATE MEDICAL CARE IF:   An unexplained oral temperature above 102 F (38.9 C) develops, or as your caregiver suggests.  You have leaking of fluid from the vagina (birth canal). If leaking membranes are suspected, take your temperature and inform your caregiver of this when you call.  There is vaginal spotting or bleeding. Notify your caregiver of the amount and how many pads are used.  You develop a bad smelling vaginal discharge with a change in the color.  You continue to feel sick to your stomach (nauseated) and have no relief from remedies suggested. You vomit blood or coffee ground-like materials.  You lose more than 2 pounds of weight in 1 week.  You gain more than 2 pounds of weight in 1 week and you notice swelling of your face, hands, feet, or legs.  You gain 5 pounds or more in 1 week (even if you do not have swelling of your hands, face, legs, or feet).  You get exposed to [place] measles and have never had them.  You are exposed to fifth disease or chickenpox.  You develop belly (abdominal) pain. Round ligament discomfort is a common non-cancerous (benign) cause of abdominal pain in pregnancy. Your caregiver still must evaluate this.  You develop headache, fever, diarrhea, pain with urination, or shortness of breath.  You fall or are in a car accident or have any kind of trauma.  There is mental or physical violence in your home. Document Released: 12/23/2000 Document Revised: 09/23/2011 Document Reviewed: 11/08/2012 [facility] Patient Information 2015 [place], [place]. This information is not intended to replace advice given to you by your health care provider. Make sure you discuss any questions you have with your health care provider.

## 2013-07-24 NOTE — Progress Notes (Signed)
Bedside U/S showed IUP with CRL 15.2 mm Gest age 537w2 d and FHT 162 bpm

## 2013-07-24 NOTE — Progress Notes (Signed)
   Subjective:    Lindsey Fuller is a G1P0000 6812w2d being seen today for her first obstetrical visit.  Her obstetrical history is significant for nothing. Patient is undecided about breast feeding. Pregnancy history fully reviewed. Abnormal Pap 12/2012. ASCUS pos HR HPV. Repeat 1 year w/ co-testing per Dr. Marice Potterove.   Patient reports heartburn, nausea, vomiting and 1x per day. Taking B6 which has been helping.  Filed Vitals:   07/24/13 1058  BP: 121/68  Pulse: 76  Weight: 131 lb (59.421 kg)    HISTORY: OB History  Gravida Para Term Preterm AB SAB TAB Ectopic Multiple Living  1 0 0 0 0 0 0 0 0 0     # Outcome Date GA Lbr Len/2nd Weight Sex Delivery Anes PTL Lv  1 CUR              Past Medical History  Diagnosis Date  . Weakness   . Anemia   . Allergy   . Abnormal Pap smear of cervix    History reviewed. No pertinent past surgical history. History reviewed. No pertinent family history.   Exam  Informal BS US shows 7.2 week live SIUP.   Uterus:     Pelvic Exam:    Perineum: deferred   Vulva: deferred                      System: Breast:  normal appearance, no masses or tenderness, No nipple retraction or dimpling   Skin: normal coloration and turgor, no rashes    Neurologic: oriented, normal mood   Extremities: No edema, Neg Homan's   HEENT sclera clear, anicteric   Mouth/Teeth mucous membranes moist, pharynx normal without lesions and dental hygiene good   Neck Supple. No masses. Thyroid normal   Cardiovascular: regular rate and rhythm, no murmurs or gallops   Respiratory:  appears well, vitals normal, no respiratory distress, acyanotic, normal RR, chest clear, no wheezing, crepitations, rhonchi, normal symmetric air entry   Abdomen: soft, non-tender; bowel sounds normal; no masses,  no organomegaly   Urinary: deferred      Assessment:    Pregnancy: G1P0000 Patient Active Problem List   Diagnosis Date Noted  . Seasonal allergies 04/06/2012  . Nasal  congestion 04/06/2012        Plan:  Encounter for supervision of normal first pregnancy in first trimester - Plan: GC/Chlamydia Probe Amp, CULTURE, URINE COMPREHENSIVE, Prenatal (OB Panel), Sickle Cell Scr, HIV antibody (with reflex)  Atypical squamous cell changes of undetermined significance (ASCUS) on cervical cytology with positive high risk human papilloma virus (HPV)  Supervision of normal pregnancy in first trimester     Initial labs drawn. Prenatal vitamins. Problem list reviewed and updated. Genetic Screening discussed First Screen: undecided.  Ultrasound discussed; fetal survey: requested.  Follow up in 4 weeks. 75% of 30 min visit spent on counseling and coordination of care.   Dorathy KinsmanSMITH, Omauri Boeve 07/24/2013

## 2013-07-25 LAB — OBSTETRIC PANEL
ANTIBODY SCREEN: NEGATIVE
BASOS ABS: 0 10*3/uL (ref 0.0–0.1)
BASOS PCT: 0 % (ref 0–1)
EOS PCT: 1 % (ref 0–5)
Eosinophils Absolute: 0.1 10*3/uL (ref 0.0–0.7)
HEMATOCRIT: 35.6 % — AB (ref 36.0–46.0)
Hemoglobin: 12.1 g/dL (ref 12.0–15.0)
Hepatitis B Surface Ag: NEGATIVE
Lymphocytes Relative: 15 % (ref 12–46)
Lymphs Abs: 1.3 10*3/uL (ref 0.7–4.0)
MCH: 28.7 pg (ref 26.0–34.0)
MCHC: 34 g/dL (ref 30.0–36.0)
MCV: 84.4 fL (ref 78.0–100.0)
Monocytes Absolute: 0.5 10*3/uL (ref 0.1–1.0)
Monocytes Relative: 6 % (ref 3–12)
NEUTROS ABS: 6.9 10*3/uL (ref 1.7–7.7)
Neutrophils Relative %: 78 % — ABNORMAL HIGH (ref 43–77)
PLATELETS: 211 10*3/uL (ref 150–400)
RBC: 4.22 MIL/uL (ref 3.87–5.11)
RDW: 13.5 % (ref 11.5–15.5)
Rh Type: POSITIVE
Rubella: 10 Index — ABNORMAL HIGH (ref <0.90)
WBC: 8.8 10*3/uL (ref 4.0–10.5)

## 2013-07-25 LAB — HIV ANTIBODY (ROUTINE TESTING W REFLEX): HIV 1&2 Ab, 4th Generation: NONREACTIVE

## 2013-07-25 LAB — SICKLE CELL SCREEN: SICKLE CELL SCREEN: NEGATIVE

## 2013-07-26 LAB — CULTURE, URINE COMPREHENSIVE
Colony Count: NO GROWTH
ORGANISM ID, BACTERIA: NO GROWTH

## 2013-07-26 LAB — GC/CHLAMYDIA PROBE AMP
CT PROBE, AMP APTIMA: NEGATIVE
GC PROBE AMP APTIMA: NEGATIVE

## 2013-08-14 ENCOUNTER — Telehealth: Payer: Self-pay | Admitting: *Deleted

## 2013-08-14 DIAGNOSIS — Z3401 Encounter for supervision of normal first pregnancy, first trimester: Secondary | ICD-10-CM

## 2013-08-14 NOTE — Telephone Encounter (Signed)
1st Trimester screening orders

## 2013-08-23 ENCOUNTER — Ambulatory Visit (INDEPENDENT_AMBULATORY_CARE_PROVIDER_SITE_OTHER): Payer: BC Managed Care – PPO | Admitting: Obstetrics & Gynecology

## 2013-08-23 VITALS — BP 116/69 | HR 68 | Wt 134.0 lb

## 2013-08-23 DIAGNOSIS — R319 Hematuria, unspecified: Secondary | ICD-10-CM

## 2013-08-23 DIAGNOSIS — Z3480 Encounter for supervision of other normal pregnancy, unspecified trimester: Secondary | ICD-10-CM

## 2013-08-23 DIAGNOSIS — Z3491 Encounter for supervision of normal pregnancy, unspecified, first trimester: Secondary | ICD-10-CM

## 2013-08-23 NOTE — Progress Notes (Signed)
Routine visit. No bleeding. Does still have nausea/vomitting but no weight loss. She is taking B6 at night. I recommended adding unisom prn. She declines the first screen but does plan to do the Quad screen and anatomy u/s.

## 2013-08-23 NOTE — Progress Notes (Signed)
Urine dip shows moderate blood

## 2013-08-25 ENCOUNTER — Encounter: Payer: Self-pay | Admitting: Obstetrics & Gynecology

## 2013-08-26 ENCOUNTER — Inpatient Hospital Stay (HOSPITAL_COMMUNITY)
Admission: AD | Admit: 2013-08-26 | Discharge: 2013-08-26 | Disposition: A | Discharge status: Home / Self Care | Payer: BC Managed Care – PPO | Source: Ambulatory Visit | Attending: Obstetrics and Gynecology | Admitting: Obstetrics and Gynecology

## 2013-08-26 ENCOUNTER — Inpatient Hospital Stay (HOSPITAL_COMMUNITY): Payer: BC Managed Care – PPO

## 2013-08-26 ENCOUNTER — Encounter (HOSPITAL_COMMUNITY): Payer: Self-pay

## 2013-08-26 DIAGNOSIS — O4690 Antepartum hemorrhage, unspecified, unspecified trimester: Secondary | ICD-10-CM

## 2013-08-26 DIAGNOSIS — O209 Hemorrhage in early pregnancy, unspecified: Secondary | ICD-10-CM | POA: Insufficient documentation

## 2013-08-26 DIAGNOSIS — O26859 Spotting complicating pregnancy, unspecified trimester: Secondary | ICD-10-CM | POA: Diagnosis present

## 2013-08-26 DIAGNOSIS — O4692 Antepartum hemorrhage, unspecified, second trimester: Secondary | ICD-10-CM

## 2013-08-26 LAB — URINALYSIS, ROUTINE W REFLEX MICROSCOPIC
Bilirubin Urine: NEGATIVE
Glucose, UA: NEGATIVE mg/dL
KETONES UR: 15 mg/dL — AB
Leukocytes, UA: NEGATIVE
Nitrite: NEGATIVE
PROTEIN: NEGATIVE mg/dL
Specific Gravity, Urine: >1.03 — ABNORMAL HIGH (ref 1.005–1.030)
Urobilinogen, UA: 0.2 mg/dL (ref 0.0–1.0)
pH: 5.5 (ref 5.0–8.0)

## 2013-08-26 LAB — CBC
HCT: 31.9 % — ABNORMAL LOW (ref 36.0–46.0)
Hemoglobin: 10.8 g/dL — ABNORMAL LOW (ref 12.0–15.0)
MCH: 29.2 pg (ref 26.0–34.0)
MCHC: 33.9 g/dL (ref 30.0–36.0)
MCV: 86.2 fL (ref 78.0–100.0)
PLATELETS: 147 10*3/uL — AB (ref 150–400)
RBC: 3.7 MIL/uL — AB (ref 3.87–5.11)
RDW: 13 % (ref 11.5–15.5)
WBC: 8.5 10*3/uL (ref 4.0–10.5)

## 2013-08-26 LAB — URINE MICROSCOPIC-ADD ON

## 2013-08-26 NOTE — MAU Note (Signed)
Pt presents stating she saw some brown spotting yesterday evening. Denies bleeding now. States she has no pain.

## 2013-08-26 NOTE — MAU Provider Note (Signed)
History     CSN: [id number]  Arrival date and time: 08/26/13 1240   First Provider Initiated Contact with Patient 08/26/13 1324      Chief Complaint  Patient presents with  . Vaginal Discharge   HPIpt is G1P0 at [redacted] weeks pregnant, pt of [facility] with confirmed IUP, eval for infections and +FHT. Pt states she had some bright red spotting last night and brown spotting this morning.  Pt denies cramping or pain.  Pt had IC about 1 week ago.  Pt denies UTI sx, constipation or diarrhea. RN note Sharen Hintaroline Brewer, RN Registered Nurse Signed  MAU Note Service date: 08/26/2013 12:54 PM   Pt presents stating she saw some brown spotting yesterday evening. Denies bleeding now. States she has no pain.    Past Medical History  Diagnosis Date  . Weakness   . Anemia   . Allergy   . Abnormal Pap smear of cervix     History reviewed. No pertinent past surgical history.  History reviewed. No pertinent family history.  History  Substance Use Topics  . Smoking status: Never Smoker   . Smokeless tobacco: Never Used  . Alcohol Use: No    Allergies:  Allergies  Allergen Reactions  . Asa Buff (Mag [Buffered Aspirin] Rash    Prescriptions prior to admission  Medication Sig Dispense Refill  . doxylamine, Sleep, (UNISOM) 25 MG tablet Take 25 mg by mouth at bedtime as needed for sleep.      . Prenatal Multivit-Min-Fe-FA (PRENATAL VITAMINS PO) Take 1 tablet by mouth daily.      . vitamin B-6 (PYRIDOXINE) 25 MG tablet Take 75 mg by mouth daily.      . fluticasone (FLONASE) 50 MCG/ACT nasal spray Place 2 sprays into the nose daily.  16 g  2    Review of Systems  Constitutional: Negative for fever and chills.  Gastrointestinal: Negative for nausea, vomiting, abdominal pain, diarrhea and constipation.  Genitourinary: Negative for dysuria.   Physical Exam   Blood pressure 116/68, pulse 95, temperature 98 F (36.7 C), temperature source Oral, resp. rate 18, last menstrual period  06/03/2013.  Physical Exam  Nursing note and vitals reviewed. Constitutional: She is oriented to person, place, and time. She appears well-developed and well-nourished. No distress.  anxious  HENT:  Head: Normocephalic.  Eyes: Pupils are equal, round, and reactive to light.  Neck: Normal range of motion. Neck supple.  Cardiovascular: Normal rate.   Respiratory: Effort normal.  GI: Soft. She exhibits no distension. There is no tenderness. There is no rebound and no guarding.  Genitourinary:  Vagina clean, small amount of think white discharge in vault; cervix closed NT, deep in vaginal vault posterior, no blood noted; uterus 12 week size, NT +FHT; Adnexa without palpable enlargement or tenderness  Musculoskeletal: Normal range of motion.  Neurological: She is alert and oriented to person, place, and time.  Skin: Skin is warm and dry.  Psychiatric: She has a normal mood and affect.    MAU Course  Procedures  + FHT with doppler Preliminary ultrasound showed 3071w3d single living [facility] Results for orders placed during the hospital encounter of 08/26/13 (from the past 24 hour(s))  URINALYSIS, ROUTINE W REFLEX MICROSCOPIC     Status: Abnormal   Collection Time    08/26/13 12:50 PM      Result Value Ref Range   Color, Urine YELLOW  YELLOW   APPearance CLEAR  CLEAR   Specific Gravity,  Urine >1.030 (*) 1.005 - 1.030   pH 5.5  5.0 - 8.0   Glucose, UA NEGATIVE  NEGATIVE mg/dL   Hgb urine dipstick MODERATE (*) NEGATIVE   Bilirubin Urine NEGATIVE  NEGATIVE   Ketones, ur 15 (*) NEGATIVE mg/dL   Protein, ur NEGATIVE  NEGATIVE mg/dL   Urobilinogen, UA 0.2  0.0 - 1.0 mg/dL   Nitrite NEGATIVE  NEGATIVE   Leukocytes, UA NEGATIVE  NEGATIVE  URINE MICROSCOPIC-ADD ON     Status: Abnormal   Collection Time    08/26/13 12:50 PM      Result Value Ref Range   Squamous Epithelial / LPF FEW (*) RARE   RBC / HPF 0-2  <3 RBC/hpf   Urine-Other MUCOUS PRESENT    CBC     Status:  Abnormal   Collection Time    08/26/13  1:25 PM      Result Value Ref Range   WBC 8.5  4.0 - 10.5 K/uL   RBC 3.70 (*) 3.87 - 5.11 MIL/uL   Hemoglobin 10.8 (*) 12.0 - 15.0 g/dL   HCT 16.1 (*) 09.6 - 04.5 %   MCV 86.2  78.0 - 100.0 fL   MCH 29.2  26.0 - 34.0 pg   MCHC 33.9  30.0 - 36.0 g/dL   RDW 40.9  81.1 - 91.4 %   Platelets 147 (*) 150 - 400 K/uL  US Ob Comp Less 14 Wks  08/26/2013   CLINICAL DATA:  Pregnant patient with spotting.  EXAM: OBSTETRIC <14 WK ULTRASOUND  TECHNIQUE: Transabdominal ultrasound was performed for evaluation of the gestation as well as the maternal uterus and adnexal regions.  COMPARISON:  None.  FINDINGS: Intrauterine gestational sac: Visualized/normal in shape. No subchorionic hemorrhage is seen.  Yolk sac:  Not visualized.  Embryo:  Visualized.  Cardiac Activity: Detected.  Heart Rate: 158 bpm  CRL:  539 cm   12 w 3 d                  [place] EDC: 03/07/2014  Maternal uterus/adnexae: Not visualized.  IMPRESSION: Single living intrauterine pregnancy without evidence of complication.   Electronically Signed   By: Drusilla Kanner M.D.   On: 08/26/2013 14:52   Assessment and Plan  Bleeding in pregnancy- nl [place] [redacted]w[redacted]d pregnant Urine culture pending Return if recurrenc of bleeding or cramping/spotting Abagale Boulos 08/26/2013, 1:25 PM

## 2013-08-27 LAB — CULTURE, URINE COMPREHENSIVE

## 2013-08-27 NOTE — MAU Provider Note (Signed)
Attestation of Attending Supervision of Advanced Practitioner (CNM/NP): Evaluation and management procedures were performed by the Advanced Practitioner under my supervision and collaboration.  I have reviewed the Advanced Practitioner's note and chart, and I agree with the management and plan.  Singleton Hickox 08/27/2013 8:01 AM

## 2013-08-28 ENCOUNTER — Telehealth: Payer: Self-pay | Admitting: *Deleted

## 2013-08-28 ENCOUNTER — Encounter: Payer: BC Managed Care – PPO | Admitting: Advanced Practice Midwife

## 2013-08-28 DIAGNOSIS — Z3401 Encounter for supervision of normal first pregnancy, first trimester: Secondary | ICD-10-CM

## 2013-08-28 LAB — URINE CULTURE: Colony Count: >=100000

## 2013-08-28 MED ORDER — PRENATAL VITAMINS PLUS 27-1 MG PO TABS: 1.0000 | ORAL_TABLET | Freq: Every day | ORAL | Status: DC

## 2013-08-28 NOTE — Telephone Encounter (Signed)
Pt called requesting a RX for Prenatal Plus Vitamin be sent to [facility] [place].

## 2013-08-29 ENCOUNTER — Other Ambulatory Visit (HOSPITAL_COMMUNITY): Payer: BC Managed Care – PPO

## 2013-08-29 ENCOUNTER — Ambulatory Visit (HOSPITAL_COMMUNITY): Payer: BC Managed Care – PPO

## 2013-08-30 ENCOUNTER — Telehealth: Payer: Self-pay | Admitting: *Deleted

## 2013-08-30 DIAGNOSIS — N390 Urinary tract infection, site not specified: Secondary | ICD-10-CM

## 2013-08-30 MED ORDER — SULFAMETHOXAZOLE-TMP DS 800-160 MG PO TABS: 1.0000 | ORAL_TABLET | Freq: Two times a day (BID) | ORAL | Status: DC

## 2013-08-30 MED ORDER — SULFAMETHOXAZOLE-TRIMETHOPRIM 400-80 MG PO TABS: 1.0000 | ORAL_TABLET | Freq: Two times a day (BID) | ORAL | Status: DC

## 2013-08-30 NOTE — Telephone Encounter (Signed)
Left Message on cell phone that she has UTI and [facility] Dr Marice Potterove.

## 2013-08-30 NOTE — Telephone Encounter (Signed)
Message copied by Granville LewisLARK, Merrell Rettinger L on Wed Aug 30, 2013 11:51 AM ------      Message from: Nicholaus BloomVE, MYRA C      Created: Wed Aug 30, 2013 11:20 AM       Please prescribe bactrim DS for a week.       Thanks ------

## 2013-09-07 ENCOUNTER — Encounter: Payer: Self-pay | Admitting: Obstetrics & Gynecology

## 2013-09-08 ENCOUNTER — Telehealth: Payer: Self-pay | Admitting: *Deleted

## 2013-09-08 DIAGNOSIS — O219 Vomiting of pregnancy, unspecified: Secondary | ICD-10-CM

## 2013-09-08 MED ORDER — DOXYLAMINE-PYRIDOXINE 10-10 MG PO TBEC: 2.0000 | DELAYED_RELEASE_TABLET | Freq: Every day | ORAL | Status: DC

## 2013-09-08 NOTE — Telephone Encounter (Signed)
Pt called requesting a RX for Diclegis for nausea.  RX sent to [facility] in [place].

## 2013-09-19 ENCOUNTER — Encounter: Payer: Self-pay | Admitting: Obstetrics & Gynecology

## 2013-09-25 ENCOUNTER — Encounter: Payer: Self-pay | Admitting: Family

## 2013-09-25 ENCOUNTER — Ambulatory Visit (INDEPENDENT_AMBULATORY_CARE_PROVIDER_SITE_OTHER): Payer: BC Managed Care – PPO | Admitting: Family

## 2013-09-25 VITALS — BP 106/63 | HR 77 | Wt 142.8 lb

## 2013-09-25 DIAGNOSIS — Z3491 Encounter for supervision of normal pregnancy, unspecified, first trimester: Secondary | ICD-10-CM

## 2013-09-25 DIAGNOSIS — Z3480 Encounter for supervision of other normal pregnancy, unspecified trimester: Secondary | ICD-10-CM

## 2013-09-25 NOTE — Progress Notes (Signed)
Urine dip shows moderate blood

## 2013-09-25 NOTE — Progress Notes (Signed)
Reviewed lab results (nml).  Nausea is improving with Diclegis.  Scheduled anatomy ultrasound.  Quad today.

## 2013-09-26 ENCOUNTER — Encounter: Payer: Self-pay | Admitting: Family

## 2013-09-26 LAB — AFP, QUAD SCREEN
AFP: 26.3 ng/mL
Curr Gest Age: 16.2 wks.days
Down Syndrome Scr Risk Est: 1:2360 {titer}
HCG, Total: 38.6 IU/mL
INH: 253.9 pg/mL
INTERPRETATION-AFP: NEGATIVE
MOM FOR AFP: 0.76
MoM for INH: 1.41
MoM for hCG: 0.98
Open Spina bifida: NEGATIVE
Osb Risk: <1:27300 {titer}
TRI 18 SCR RISK EST: NEGATIVE
Trisomy 18 (Edward) Syndrome Interp.: 1:52200 {titer}
uE3 Mom: 1.36
uE3 Value: 1.21 ng/mL

## 2013-10-16 ENCOUNTER — Ambulatory Visit (HOSPITAL_COMMUNITY)
Admission: RE | Admit: 2013-10-16 | Discharge: 2013-10-16 | Disposition: A | Discharge status: Home / Self Care | Payer: BC Managed Care – PPO | Source: Ambulatory Visit | Attending: Family | Admitting: Family

## 2013-10-16 DIAGNOSIS — Z3689 Encounter for other specified antenatal screening: Secondary | ICD-10-CM

## 2013-10-16 DIAGNOSIS — Z3491 Encounter for supervision of normal pregnancy, unspecified, first trimester: Secondary | ICD-10-CM

## 2013-10-16 DIAGNOSIS — Z3481 Encounter for supervision of other normal pregnancy, first trimester: Secondary | ICD-10-CM | POA: Insufficient documentation

## 2013-10-16 DIAGNOSIS — Z3A19 19 weeks gestation of pregnancy: Secondary | ICD-10-CM

## 2013-10-17 ENCOUNTER — Encounter: Payer: Self-pay | Admitting: Family

## 2013-10-23 ENCOUNTER — Ambulatory Visit (INDEPENDENT_AMBULATORY_CARE_PROVIDER_SITE_OTHER): Payer: BC Managed Care – PPO | Admitting: Advanced Practice Midwife

## 2013-10-23 VITALS — BP 124/73 | HR 74

## 2013-10-23 DIAGNOSIS — Z3491 Encounter for supervision of normal pregnancy, unspecified, first trimester: Secondary | ICD-10-CM

## 2013-10-23 DIAGNOSIS — Z3481 Encounter for supervision of other normal pregnancy, first trimester: Secondary | ICD-10-CM

## 2013-10-23 NOTE — Progress Notes (Signed)
Doing well.  Good fetal movement, denies vaginal bleeding, LOF, cramping/contractions.  Nausea improved, still taking some Diclegis. May wean off and see how she does.  Some mild backache, discussed pregnancy support belt, safe exercise.  Reviewed normal anatomy scan.

## 2013-11-13 ENCOUNTER — Encounter: Payer: Self-pay | Admitting: Family

## 2013-11-20 ENCOUNTER — Encounter: Payer: BC Managed Care – PPO | Admitting: Advanced Practice Midwife

## 2013-11-22 ENCOUNTER — Ambulatory Visit (INDEPENDENT_AMBULATORY_CARE_PROVIDER_SITE_OTHER): Payer: BC Managed Care – PPO | Admitting: Obstetrics & Gynecology

## 2013-11-22 ENCOUNTER — Encounter: Payer: Self-pay | Admitting: Obstetrics & Gynecology

## 2013-11-22 VITALS — BP 128/70 | HR 89 | Wt 159.0 lb

## 2013-11-22 DIAGNOSIS — Z3491 Encounter for supervision of normal pregnancy, unspecified, first trimester: Secondary | ICD-10-CM

## 2013-11-22 DIAGNOSIS — Z3481 Encounter for supervision of other normal pregnancy, first trimester: Secondary | ICD-10-CM

## 2013-11-22 NOTE — Progress Notes (Signed)
Hungry all the time.  Weight gain.

## 2013-11-22 NOTE — Progress Notes (Signed)
Routine visit. Good FM. No problems. We discussed recommended weight gain.Glucola, labs, tdap at next visit. She got her flu vaccine at [facility] where her husband works.

## 2013-12-14 ENCOUNTER — Encounter: Payer: Self-pay | Admitting: Obstetrics & Gynecology

## 2013-12-14 ENCOUNTER — Ambulatory Visit (INDEPENDENT_AMBULATORY_CARE_PROVIDER_SITE_OTHER): Payer: BLUE CROSS/BLUE SHIELD | Admitting: Obstetrics & Gynecology

## 2013-12-14 VITALS — BP 139/84 | HR 90 | Wt 164.0 lb

## 2013-12-14 DIAGNOSIS — Z9189 Other specified personal risk factors, not elsewhere classified: Principal | ICD-10-CM

## 2013-12-14 DIAGNOSIS — Z2839 Other underimmunization status: Secondary | ICD-10-CM

## 2013-12-14 DIAGNOSIS — Z3403 Encounter for supervision of normal first pregnancy, third trimester: Secondary | ICD-10-CM

## 2013-12-14 DIAGNOSIS — Z3493 Encounter for supervision of normal pregnancy, unspecified, third trimester: Secondary | ICD-10-CM

## 2013-12-14 DIAGNOSIS — Z3491 Encounter for supervision of normal pregnancy, unspecified, first trimester: Secondary | ICD-10-CM

## 2013-12-14 DIAGNOSIS — Z36 Encounter for antenatal screening of mother: Secondary | ICD-10-CM

## 2013-12-14 DIAGNOSIS — Z23 Encounter for immunization: Secondary | ICD-10-CM | POA: Diagnosis not present

## 2013-12-14 MED ORDER — TETANUS-DIPHTH-ACELL PERTUSSIS 5-2.5-18.5 LF-MCG/0.5 IM SUSP
0.5000 mL | Freq: Once | INTRAMUSCULAR | Status: AC
  Administered 2013-12-14: 0.5 mL via INTRAMUSCULAR

## 2013-12-14 NOTE — Progress Notes (Signed)
Pt c/o's leg cramps every night

## 2013-12-14 NOTE — Addendum Note (Signed)
Addended by: Arne ClevelandHUTCHINSON, MANDY J on: 12/14/2013 04:03 PM   Modules accepted: Orders

## 2013-12-14 NOTE — Progress Notes (Signed)
Routine visit. Good FM. No problems. Glucola, tdap, labs today.

## 2013-12-15 LAB — CBC
HEMATOCRIT: 31.9 % — AB (ref 36.0–46.0)
Hemoglobin: 10.4 g/dL — ABNORMAL LOW (ref 12.0–15.0)
MCH: 28.2 pg (ref 26.0–34.0)
MCHC: 32.6 g/dL (ref 30.0–36.0)
MCV: 86.4 fL (ref 78.0–100.0)
MPV: 9.4 fL (ref 9.4–12.4)
Platelets: 184 10*3/uL (ref 150–400)
RBC: 3.69 MIL/uL — AB (ref 3.87–5.11)
RDW: 14 % (ref 11.5–15.5)
WBC: 10.1 10*3/uL (ref 4.0–10.5)

## 2013-12-15 LAB — GLUCOSE TOLERANCE, 1 HOUR (50G) W/O FASTING: GLUCOSE 1 HOUR GTT: 124 mg/dL (ref 70–140)

## 2013-12-15 LAB — COMPREHENSIVE METABOLIC PANEL
ALBUMIN: 3.1 g/dL — AB (ref 3.5–5.2)
ALT: 14 U/L (ref 0–35)
AST: 19 U/L (ref 0–37)
Alkaline Phosphatase: 52 U/L (ref 39–117)
BUN: 7 mg/dL (ref 6–23)
CALCIUM: 8.6 mg/dL (ref 8.4–10.5)
CHLORIDE: 104 meq/L (ref 96–112)
CO2: 19 meq/L (ref 19–32)
Creat: 0.47 mg/dL — ABNORMAL LOW (ref 0.50–1.10)
Glucose, Bld: 127 mg/dL — ABNORMAL HIGH (ref 70–99)
POTASSIUM: 3.7 meq/L (ref 3.5–5.3)
Sodium: 135 mEq/L (ref 135–145)
Total Bilirubin: 0.2 mg/dL (ref 0.2–1.2)
Total Protein: 6.1 g/dL (ref 6.0–8.3)

## 2013-12-15 LAB — RPR

## 2013-12-15 LAB — HIV ANTIBODY (ROUTINE TESTING W REFLEX): HIV: NONREACTIVE

## 2013-12-18 ENCOUNTER — Encounter: Payer: Self-pay | Admitting: Obstetrics & Gynecology

## 2013-12-28 ENCOUNTER — Ambulatory Visit (INDEPENDENT_AMBULATORY_CARE_PROVIDER_SITE_OTHER): Payer: BC Managed Care – PPO | Admitting: Obstetrics & Gynecology

## 2013-12-28 VITALS — BP 136/76 | HR 80 | Wt 167.0 lb

## 2013-12-28 DIAGNOSIS — Z3481 Encounter for supervision of other normal pregnancy, first trimester: Secondary | ICD-10-CM

## 2013-12-28 DIAGNOSIS — Z3491 Encounter for supervision of normal pregnancy, unspecified, first trimester: Secondary | ICD-10-CM

## 2013-12-28 NOTE — Progress Notes (Signed)
37 pound weight gain.  Pt started out 120 and 5'8".  Discussed food choices and exercise.

## 2013-12-28 NOTE — Progress Notes (Signed)
Pt is concerned about weight gain

## 2014-01-12 NOTE — L&D Delivery Note (Signed)
Delivery Note At 3:48 AM a viable and healthy female was delivered via Vaginal, Spontaneous Delivery (Presentation: ROA ).  APGAR: 9, 9; weight pending .   Placenta status: Intact, Spontaneous.  Cord: 3 vessels with the following complications: None.    Anesthesia: Epidural  Episiotomy: None Lacerations: None Suture Repair: n/a Est. Blood Loss (mL): 200  Vigorous female infant delivered over intact perineum. Immediate cry, placed on mother's abdomen.  Mom to postpartum.  Baby to Couplet care / Skin to Skin.  Beverely Lowdamo, Elena 03/05/2014, 4:19 AM   Delivery attended by me also No difficulty with shoulders Agree with note Aviva SignsMarie L Kelisha Dall, CNM

## 2014-01-15 ENCOUNTER — Ambulatory Visit (INDEPENDENT_AMBULATORY_CARE_PROVIDER_SITE_OTHER): Payer: BLUE CROSS/BLUE SHIELD | Admitting: Obstetrics & Gynecology

## 2014-01-15 VITALS — BP 133/79 | HR 76 | Wt 169.0 lb

## 2014-01-15 DIAGNOSIS — Z3481 Encounter for supervision of other normal pregnancy, first trimester: Secondary | ICD-10-CM

## 2014-01-15 DIAGNOSIS — Z3491 Encounter for supervision of normal pregnancy, unspecified, first trimester: Secondary | ICD-10-CM

## 2014-01-15 NOTE — Progress Notes (Signed)
Pt had back pain on 12/29 adn 12/29.  No contractions other than a rare BH.   No discharge or vaginal bleeding.  Has some pelvic bone pressure.  Pt does not wish to have a cervical exam today.  She is also due for pap smear but refuses that today.  No other issues. Pt given PTL precautions.

## 2014-01-29 ENCOUNTER — Ambulatory Visit (INDEPENDENT_AMBULATORY_CARE_PROVIDER_SITE_OTHER): Payer: BLUE CROSS/BLUE SHIELD | Admitting: Obstetrics & Gynecology

## 2014-01-29 ENCOUNTER — Encounter: Payer: Self-pay | Admitting: Obstetrics & Gynecology

## 2014-01-29 VITALS — BP 129/71 | HR 83 | Wt 172.0 lb

## 2014-01-29 DIAGNOSIS — Z3491 Encounter for supervision of normal pregnancy, unspecified, first trimester: Secondary | ICD-10-CM

## 2014-01-29 DIAGNOSIS — Z3481 Encounter for supervision of other normal pregnancy, first trimester: Secondary | ICD-10-CM

## 2014-01-29 NOTE — Progress Notes (Signed)
Routine visit. Good FM. No problems. Cultures at next visit.We discussed her ASCUS pap. She will get another pap with cotesting at her post partum visit.

## 2014-02-08 ENCOUNTER — Other Ambulatory Visit: Payer: Self-pay | Admitting: Obstetrics & Gynecology

## 2014-02-08 ENCOUNTER — Ambulatory Visit (INDEPENDENT_AMBULATORY_CARE_PROVIDER_SITE_OTHER): Payer: BLUE CROSS/BLUE SHIELD | Admitting: Obstetrics & Gynecology

## 2014-02-08 VITALS — BP 129/73 | HR 83 | Wt 172.0 lb

## 2014-02-08 DIAGNOSIS — Z3483 Encounter for supervision of other normal pregnancy, third trimester: Secondary | ICD-10-CM

## 2014-02-08 DIAGNOSIS — Z36 Encounter for antenatal screening of mother: Secondary | ICD-10-CM

## 2014-02-08 DIAGNOSIS — Z3493 Encounter for supervision of normal pregnancy, unspecified, third trimester: Secondary | ICD-10-CM

## 2014-02-08 DIAGNOSIS — R87810 Cervical high risk human papillomavirus (HPV) DNA test positive: Principal | ICD-10-CM

## 2014-02-08 DIAGNOSIS — R87610 Atypical squamous cells of undetermined significance on cytologic smear of cervix (ASC-US): Secondary | ICD-10-CM

## 2014-02-08 DIAGNOSIS — Z3491 Encounter for supervision of normal pregnancy, unspecified, first trimester: Secondary | ICD-10-CM

## 2014-02-08 LAB — OB RESULTS CONSOLE GC/CHLAMYDIA
CHLAMYDIA, DNA PROBE: NEGATIVE
Gonorrhea: NEGATIVE

## 2014-02-08 LAB — OB RESULTS CONSOLE GBS: GBS: NEGATIVE

## 2014-02-08 NOTE — Patient Instructions (Signed)
Contraception Choices Contraception (birth control) is the use of any methods or devices to prevent pregnancy. Below are some methods to help avoid pregnancy. HORMONAL METHODS   Contraceptive implant. This is a thin, plastic tube containing progesterone hormone. It does not contain estrogen hormone. Your health care provider inserts the tube in the inner part of the upper arm. The tube can remain in place for up to 3 years. After 3 years, the implant must be removed. The implant prevents the ovaries from releasing an egg (ovulation), thickens the cervical mucus to prevent sperm from entering the uterus, and thins the lining of the inside of the uterus.  Progesterone-only injections. These injections are given every 3 months by your health care provider to prevent pregnancy. This synthetic progesterone hormone stops the ovaries from releasing eggs. It also thickens cervical mucus and changes the uterine lining. This makes it harder for sperm to survive in the uterus.  Birth control pills. These pills contain estrogen and progesterone hormone. They work by preventing the ovaries from releasing eggs (ovulation). They also cause the cervical mucus to thicken, preventing the sperm from entering the uterus. Birth control pills are prescribed by a health care provider.Birth control pills can also be used to treat heavy periods.  Minipill. This type of birth control pill contains only the progesterone hormone. They are taken every day of each month and must be prescribed by your health care provider.  Birth control patch. The patch contains hormones similar to those in birth control pills. It must be changed once a week and is prescribed by a health care provider.  Vaginal ring. The ring contains hormones similar to those in birth control pills. It is left in the vagina for 3 weeks, removed for 1 week, and then a new one is put back in place. The patient must be comfortable inserting and removing the ring  from the vagina.A health care provider's prescription is necessary.  Emergency contraception. Emergency contraceptives prevent pregnancy after unprotected sexual intercourse. This pill can be taken right after sex or up to 5 days after unprotected sex. It is most effective the sooner you take the pills after having sexual intercourse. Most emergency contraceptive pills are available without a prescription. Check with your pharmacist. Do not use emergency contraception as your only form of birth control. BARRIER METHODS   Female condom. This is a thin sheath (latex or rubber) that is worn over the penis during sexual intercourse. It can be used with spermicide to increase effectiveness.  Female condom. This is a soft, loose-fitting sheath that is put into the vagina before sexual intercourse.  Diaphragm. This is a soft, latex, dome-shaped barrier that must be fitted by a health care provider. It is inserted into the vagina, along with a spermicidal jelly. It is inserted before intercourse. The diaphragm should be left in the vagina for 6 to 8 hours after intercourse.  Cervical cap. This is a round, soft, latex or plastic cup that fits over the cervix and must be fitted by a health care provider. The cap can be left in place for up to 48 hours after intercourse.  Sponge. This is a soft, circular piece of polyurethane foam. The sponge has spermicide in it. It is inserted into the vagina after wetting it and before sexual intercourse.  Spermicides. These are chemicals that kill or block sperm from entering the cervix and uterus. They come in the form of creams, jellies, suppositories, foam, or tablets. They do not require a   prescription. They are inserted into the vagina with an applicator before having sexual intercourse. The process must be repeated every time you have sexual intercourse. INTRAUTERINE CONTRACEPTION  Intrauterine device (IUD). This is a T-shaped device that is put in a woman's uterus  during a menstrual period to prevent pregnancy. There are 2 types:  Copper IUD. This type of IUD is wrapped in copper wire and is placed inside the uterus. Copper makes the uterus and fallopian tubes produce a fluid that kills sperm. It can stay in place for 10 years.  Hormone IUD. This type of IUD contains the hormone progestin (synthetic progesterone). The hormone thickens the cervical mucus and prevents sperm from entering the uterus, and it also thins the uterine lining to prevent implantation of a fertilized egg. The hormone can weaken or kill the sperm that get into the uterus. It can stay in place for 3-5 years, depending on which type of IUD is used. PERMANENT METHODS OF CONTRACEPTION  Female tubal ligation. This is when the woman's fallopian tubes are surgically sealed, tied, or blocked to prevent the egg from traveling to the uterus.  Hysteroscopic sterilization. This involves placing a small coil or insert into each fallopian tube. Your doctor uses a technique called hysteroscopy to do the procedure. The device causes scar tissue to form. This results in permanent blockage of the fallopian tubes, so the sperm cannot fertilize the egg. It takes about 3 months after the procedure for the tubes to become blocked. You must use another form of birth control for these 3 months.  Female sterilization. This is when the female has the tubes that carry sperm tied off (vasectomy).This blocks sperm from entering the vagina during sexual intercourse. After the procedure, the man can still ejaculate fluid (semen). NATURAL PLANNING METHODS  Natural family planning. This is not having sexual intercourse or using a barrier method (condom, diaphragm, cervical cap) on days the woman could become pregnant.  Calendar method. This is keeping track of the length of each menstrual cycle and identifying when you are fertile.  Ovulation method. This is avoiding sexual intercourse during ovulation.  Symptothermal  method. This is avoiding sexual intercourse during ovulation, using a thermometer and ovulation symptoms.  Post-ovulation method. This is timing sexual intercourse after you have ovulated. Regardless of which type or method of contraception you choose, it is important that you use condoms to protect against the transmission of sexually transmitted infections (STIs). Talk with your health care provider about which form of contraception is most appropriate for you. Document Released: 12/29/2004 Document Revised: 01/03/2013 Document Reviewed: 06/23/2012 [employer] Patient Information 2015 [employer]. This information is not intended to replace advice given to you by your health care provider. Make sure you discuss any questions you have with your health care provider.  

## 2014-02-08 NOTE — Progress Notes (Signed)
Considering OCPs for birth control.  Needs to find pediatrician.  Cultures today.

## 2014-02-10 LAB — GC/CHLAMYDIA PROBE AMP
CT Probe RNA: NEGATIVE
GC Probe RNA: NEGATIVE

## 2014-02-10 LAB — CULTURE, BETA STREP (GROUP B ONLY)

## 2014-02-15 ENCOUNTER — Encounter: Payer: BLUE CROSS/BLUE SHIELD | Admitting: Obstetrics & Gynecology

## 2014-02-16 ENCOUNTER — Ambulatory Visit (INDEPENDENT_AMBULATORY_CARE_PROVIDER_SITE_OTHER): Payer: BLUE CROSS/BLUE SHIELD | Admitting: Advanced Practice Midwife

## 2014-02-16 VITALS — BP 120/69 | HR 98 | Wt 174.0 lb

## 2014-02-16 DIAGNOSIS — Z3491 Encounter for supervision of normal pregnancy, unspecified, first trimester: Secondary | ICD-10-CM

## 2014-02-16 DIAGNOSIS — O26893 Other specified pregnancy related conditions, third trimester: Secondary | ICD-10-CM

## 2014-02-16 DIAGNOSIS — Z3481 Encounter for supervision of other normal pregnancy, first trimester: Secondary | ICD-10-CM

## 2014-02-16 DIAGNOSIS — M549 Dorsalgia, unspecified: Secondary | ICD-10-CM

## 2014-02-16 DIAGNOSIS — O9989 Other specified diseases and conditions complicating pregnancy, childbirth and the puerperium: Secondary | ICD-10-CM

## 2014-02-16 MED ORDER — FAMOTIDINE 20 MG PO TABS: 20.0000 mg | ORAL_TABLET | Freq: Two times a day (BID) | ORAL | Status: DC

## 2014-02-16 NOTE — Patient Instructions (Signed)
Braxton Hicks Contractions Contractions of the uterus can occur throughout pregnancy. Contractions are not always a sign that you are in labor.  WHAT ARE BRAXTON HICKS CONTRACTIONS?  Contractions that occur before labor are called Braxton Hicks contractions, or false labor. Toward the end of pregnancy (32-34 weeks), these contractions can develop more often and may become more forceful. This is not true labor because these contractions do not result in opening (dilatation) and thinning of the cervix. They are sometimes difficult to tell apart from true labor because these contractions can be forceful and people have different pain tolerances. You should not feel embarrassed if you go to the hospital with false labor. Sometimes, the only way to tell if you are in true labor is for your health care provider to look for changes in the cervix. If there are no prenatal problems or other health problems associated with the pregnancy, it is completely safe to be sent home with false labor and await the onset of true labor. HOW CAN YOU TELL THE DIFFERENCE BETWEEN TRUE AND FALSE LABOR? False Labor  The contractions of false labor are usually shorter and not as hard as those of true labor.   The contractions are usually irregular.   The contractions are often felt in the front of the lower abdomen and in the groin.   The contractions may go away when you walk around or change positions while lying down.   The contractions get weaker and are shorter lasting as time goes on.   The contractions do not usually become progressively stronger, regular, and closer together as with true labor.  True Labor  Contractions in true labor last 30-70 seconds, become very regular, usually become more intense, and increase in frequency.   The contractions do not go away with walking.   The discomfort is usually felt in the top of the uterus and spreads to the lower abdomen and low back.   True labor can be  determined by your health care provider with an exam. This will show that the cervix is dilating and getting thinner.  WHAT TO REMEMBER  Keep up with your usual exercises and follow other instructions given by your health care provider.   Take medicines as directed by your health care provider.   Keep your regular prenatal appointments.   Eat and drink lightly if you think you are going into labor.   If Braxton Hicks contractions are making you uncomfortable:     Back Pain in Pregnancy Back pain during pregnancy is common. It happens in about half of all pregnancies. It is important for you and your baby that you remain active during your pregnancy.If you feel that back pain is not allowing you to remain active or sleep well, it is time to see your caregiver. Back pain may be caused by several factors related to changes during your pregnancy.Fortunately, unless you had trouble with your back before your pregnancy, the pain is likely to get better after you deliver. Low back pain usually occurs between the fifth and seventh months of pregnancy. It can, however, happen in the first couple months. Factors that increase the risk of back problems include:   Previous back problems.  Injury to your back.  Having twins or multiple births.  A chronic cough.  Stress.  Job-related repetitive motions.  Muscle or spinal disease in the back.  Family history of back problems, ruptured (herniated) discs, or osteoporosis.  Depression, anxiety, and panic attacks. CAUSES   When  you are pregnant, your body produces a hormone called relaxin. This hormonemakes the ligaments connecting the low back and pubic bones more flexible. This flexibility allows the baby to be delivered more easily. When your ligaments are loose, your muscles need to work harder to support your back. Soreness in your back can come from tired muscles. Soreness can also come from back tissues that are irritated since  they are receiving less support.  As the baby grows, it puts pressure on the nerves and blood vessels in your pelvis. This can cause back pain.  As the baby grows and gets heavier during pregnancy, the uterus pushes the stomach muscles forward and changes your center of gravity. This makes your back muscles work harder to maintain good posture. SYMPTOMS  Lumbar pain during pregnancy Lumbar pain during pregnancy usually occurs at or above the waist in the center of the back. There may be pain and numbness that radiates into your leg or foot. This is similar to low back pain experienced by non-pregnant women. It usually increases with sitting for long periods of time, standing, or repetitive lifting. Tenderness may also be present in the muscles along your upper back. Posterior pelvic pain during pregnancy Pain in the back of the pelvis is more common than lumbar pain in pregnancy. It is a deep pain felt in your side at the waistline, or across the tailbone (sacrum), or in both places. You may have pain on one or both sides. This pain can also go into the buttocks and backs of the upper thighs. Pubic and groin pain may also be present. The pain does not quickly resolve with rest, and morning stiffness may also be present. Pelvic pain during pregnancy can be brought on by most activities. A high level of fitness before and during pregnancy may or may not prevent this problem. Labor pain is usually 1 to 2 minutes apart, lasts for about 1 minute, and involves a bearing down feeling or pressure in your pelvis. However, if you are at term with the pregnancy, constant low back pain can be the beginning of early labor, and you should be aware of this. DIAGNOSIS  X-rays of the back should not be done during the first 12 to 14 weeks of the pregnancy and only when absolutely necessary during the rest of the pregnancy. MRIs do not give off radiation and are safe during pregnancy. MRIs also should only be done when  absolutely necessary. HOME CARE INSTRUCTIONS  Exercise as directed by your caregiver. Exercise is the most effective way to prevent or manage back pain. If you have a back problem, it is especially important to avoid sports that require sudden body movements. Swimming and walking are great activities.  Do not stand in one place for long periods of time.  Do not wear high heels.  Sit in chairs with good posture. Use a pillow on your lower back if necessary. Make sure your head rests over your shoulders and is not hanging forward.  Try sleeping on your side, preferably the left side, with a pillow or two between your legs. If you are sore after a night's rest, your bedmay betoo soft.Try placing a board between your mattress and box spring.  Listen to your body when lifting.If you are experiencing pain, ask for help or try bending yourknees more so you can use your leg muscles rather than your back muscles. Squat down when picking up something from the floor. Do not bend over.  Eat a  healthy diet. Try to gain weight within your caregiver's recommendations.  Use heat or cold packs 3 to 4 times a day for 15 minutes to help with the pain.  Only take over-the-counter or prescription medicines for pain, discomfort, or fever as directed by your caregiver. Sudden (acute) back pain  Use bed rest for only the most extreme, acute episodes of back pain. Prolonged bed rest over 48 hours will aggravate your condition.  Ice is very effective for acute conditions.  Put ice in a plastic bag.  Place a towel between your skin and the bag.  Leave the ice on for 10 to 20 minutes every 2 hours, or as needed.  Using heat packs for 30 minutes prior to activities is also helpful. Continued back pain See your caregiver if you have continued problems. Your caregiver can help or refer you for appropriate physical therapy. With conditioning, most back problems can be avoided. Sometimes, a more serious issue  may be the cause of back pain. You should be seen right away if new problems seem to be developing. Your caregiver may recommend:  A maternity girdle.  An elastic sling.  A back brace.  A massage therapist or acupuncture. SEEK MEDICAL CARE IF:   You are not able to do most of your daily activities, even when taking the pain medicine you were given.  You need a referral to a physical therapist or chiropractor.  You want to try acupuncture. SEEK IMMEDIATE MEDICAL CARE IF:  You develop numbness, tingling, weakness, or problems with the use of your arms or legs.  You develop severe back pain that is no longer relieved with medicines.  You have a sudden change in bowel or bladder control.  You have increasing pain in other areas of the body.  You develop shortness of breath, dizziness, or fainting.  You develop nausea, vomiting, or sweating.  You have back pain which is similar to labor pains.  You have back pain along with your water breaking or vaginal bleeding.  You have back pain or numbness that travels down your leg.  Your back pain developed after you fell.  You develop pain on one side of your back. You may have a kidney stone.  You see blood in your urine. You may have a bladder infection or kidney stone.  You have back pain with blisters. You may have shingles. Back pain is fairly common during pregnancy but should not be accepted as just part of the process. Back pain should always be treated as soon as possible. This will make your pregnancy as pleasant as possible. Document Released: 04/08/2005 Document Revised: 03/23/2011 Document Reviewed: 05/20/2010 [facility] Patient Information 2015 [place], [place]. This information is not intended to replace advice given to you by your health care provider. Make sure you discuss any questions you have with your health care provider.   Change your position from lying down or resting to walking, or from walking to resting.    Sit and rest in a tub of warm water.   Drink 2-3 glasses of water. Dehydration may cause these contractions.   Do slow and deep breathing several times an hour.  WHEN SHOULD I SEEK IMMEDIATE MEDICAL CARE? Seek immediate medical care if:  Your contractions become stronger, more regular, and closer together.   You have fluid leaking or gushing from your vagina.   You have a fever.   You pass blood-tinged mucus.   You have vaginal bleeding.   You have continuous abdominal  pain.   You have low back pain that you never had before.   You feel your baby's head pushing down and causing pelvic pressure.   Your baby is not moving as much as it used to.  Document Released: 12/29/2004 Document Revised: 01/03/2013 Document Reviewed: 10/10/2012 [facility] Patient Information 2015 [place], [place]. This information is not intended to replace advice given to you by your health care provider. Make sure you discuss any questions you have with your health care provider.

## 2014-02-16 NOTE — Progress Notes (Signed)
Increased BH and LBP. Comfort measures. Labor precautions. Neg GBS.

## 2014-02-16 NOTE — Progress Notes (Signed)
Pt states she has had lower back pain x 4 days

## 2014-02-22 ENCOUNTER — Encounter (HOSPITAL_COMMUNITY): Payer: Self-pay | Admitting: *Deleted

## 2014-02-22 ENCOUNTER — Inpatient Hospital Stay (HOSPITAL_COMMUNITY)
Admission: AD | Admit: 2014-02-22 | Discharge: 2014-02-22 | Disposition: A | Discharge status: Home / Self Care | Payer: BLUE CROSS/BLUE SHIELD | Source: Ambulatory Visit | Attending: Obstetrics & Gynecology | Admitting: Obstetrics & Gynecology

## 2014-02-22 ENCOUNTER — Ambulatory Visit (INDEPENDENT_AMBULATORY_CARE_PROVIDER_SITE_OTHER): Payer: BLUE CROSS/BLUE SHIELD | Admitting: Obstetrics & Gynecology

## 2014-02-22 VITALS — BP 125/83 | HR 91 | Wt 175.0 lb

## 2014-02-22 DIAGNOSIS — Z3491 Encounter for supervision of normal pregnancy, unspecified, first trimester: Secondary | ICD-10-CM

## 2014-02-22 DIAGNOSIS — Z3A37 37 weeks gestation of pregnancy: Secondary | ICD-10-CM | POA: Diagnosis not present

## 2014-02-22 DIAGNOSIS — Z3481 Encounter for supervision of other normal pregnancy, first trimester: Secondary | ICD-10-CM

## 2014-02-22 DIAGNOSIS — O26853 Spotting complicating pregnancy, third trimester: Secondary | ICD-10-CM | POA: Diagnosis not present

## 2014-02-22 DIAGNOSIS — N920 Excessive and frequent menstruation with regular cycle: Secondary | ICD-10-CM

## 2014-02-22 LAB — POCT FERN TEST: POCT FERN TEST: NEGATIVE

## 2014-02-22 NOTE — Progress Notes (Signed)
Routine visit. Good FM. No problems. Labor precautions reviewed. Excellent pelvis. 

## 2014-02-22 NOTE — MAU Provider Note (Signed)
First Provider Initiated Contact with Patient 02/22/14 2305      Chief Complaint:  Manson PasseyBrown discharge  Lindsey Fuller [place] is  29 y.o. G1P0000 at 604w5d presents c/o brown spotting tonight.  She was examined in the office today and found to be 2/90-2.  States she has had a this discharge for several weeks.  Denies contractions.  Obstetrical/Gynecological History: OB History    Gravida Para Term Preterm AB TAB SAB Ectopic Multiple Living   1 0 0 0 0 0 0 0 0 0      Past Medical History: Past Medical History  Diagnosis Date  . Weakness   . Anemia   . Allergy   . Abnormal Pap smear of cervix     Past Surgical History: History reviewed. No pertinent past surgical history.  Family History: History reviewed. No pertinent family history.  Social History: History  Substance Use Topics  . Smoking status: Never Smoker   . Smokeless tobacco: Never Used  . Alcohol Use: No    Allergies:  Allergies  Allergen Reactions  . Asa Buff (Mag [Buffered Aspirin] Rash    Meds:  Prescriptions prior to admission  Medication Sig Dispense Refill Last Dose  . Doxylamine-Pyridoxine (DICLEGIS) 10-10 MG TBEC Take 2 tablets by mouth at bedtime. 60 tablet 3 Taking  . famotidine (PEPCID) 20 MG tablet Take 1 tablet (20 mg total) by mouth 2 (two) times daily. 60 tablet 3 Taking  . Prenat-Fe Carbonyl-FA-Omega 3 (ONE-A-DAY WOMENS PRENATAL 1) 28-0.8-235 MG CAPS Take by mouth.   Taking    Review of Systems   Constitutional: Negative for fever and chills Eyes: Negative for visual disturbances Respiratory: Negative for shortness of breath, dyspnea Cardiovascular: Negative for chest pain or palpitations  Gastrointestinal: Negative for vomiting, diarrhea and constipation Genitourinary: Negative for dysuria and urgency Musculoskeletal: Negative for back pain, joint pain, myalgias  Neurological: Negative for dizziness and headaches     Physical Exam  Blood pressure 118/68, pulse 92, temperature 98.6 F (37  C), temperature source Oral, resp. rate 16, height 5\' 6"  (1.676 m), weight 80.74 kg (178 lb), last menstrual period 06/03/2013, SpO2 98 %. GENERAL: Well-developed, well-nourished female in no acute distress.  LUNGS: Clear to auscultation bilaterally.  HEART: Regular rate and rhythm. ABDOMEN: Soft, nontender, nondistended, gravid.  EXTREMITIES: Nontender, no edema, 2+ distal pulses. DTR's 2+ SSE:  Scant amount of thin brown discharge.  Negative pooling, valsalva, and fern CERVICAL EXAM: Dilatation 2cm   Effacement 90%   Station -2  Per RN Presentation: cephalic FHT:  Baseline rate 145 bpm   Variability moderate  Accelerations present   Decelerations none Contractions: Every 0 mins   Labs: Results for orders placed or performed during the hospital encounter of 02/22/14 (from the past 24 hour(s))  Fern Test   Collection Time: 02/22/14 10:49 PM  Result Value Ref Range   POCT Fern Test Negative = intact amniotic membranes    Imaging Studies:  No results found.  Assessment: Lindsey Fuller Newborn is  29 y.o. G1P0000 at 144w5d presents with spotting after digital cervical exam.  Plan: Dc home with labor precautions.  CRESENZO-DISHMAN,Juanya Villavicencio 2/11/201611:13 PM

## 2014-02-22 NOTE — Discharge Instructions (Signed)
Braxton Hicks Contractions °Contractions of the uterus can occur throughout pregnancy. Contractions are not always a sign that you are in labor.  °WHAT ARE BRAXTON HICKS CONTRACTIONS?  °Contractions that occur before labor are called Braxton Hicks contractions, or false labor. Toward the end of pregnancy (32-34 weeks), these contractions can develop more often and may become more forceful. This is not true labor because these contractions do not result in opening (dilatation) and thinning of the cervix. They are sometimes difficult to tell apart from true labor because these contractions can be forceful and people have different pain tolerances. You should not feel embarrassed if you go to the hospital with false labor. Sometimes, the only way to tell if you are in true labor is for your health care provider to look for changes in the cervix. °If there are no prenatal problems or other health problems associated with the pregnancy, it is completely safe to be sent home with false labor and await the onset of true labor. °HOW CAN YOU TELL THE DIFFERENCE BETWEEN TRUE AND FALSE LABOR? °False Labor °· The contractions of false labor are usually shorter and not as hard as those of true labor.   °· The contractions are usually irregular.   °· The contractions are often felt in the front of the lower abdomen and in the groin.   °· The contractions may go away when you walk around or change positions while lying down.   °· The contractions get weaker and are shorter lasting as time goes on.   °· The contractions do not usually become progressively stronger, regular, and closer together as with true labor.   °True Labor °· Contractions in true labor last 30-70 seconds, become very regular, usually become more intense, and increase in frequency.   °· The contractions do not go away with walking.   °· The discomfort is usually felt in the top of the uterus and spreads to the lower abdomen and low back.   °· True labor can be  determined by your health care provider with an exam. This will show that the cervix is dilating and getting thinner.   °WHAT TO REMEMBER °· Keep up with your usual exercises and follow other instructions given by your health care provider.   °· Take medicines as directed by your health care provider.   °· Keep your regular prenatal appointments.   °· Eat and drink lightly if you think you are going into labor.   °· If Braxton Hicks contractions are making you uncomfortable:   °¨ Change your position from lying down or resting to walking, or from walking to resting.   °¨ Sit and rest in a tub of warm water.   °¨ Drink 2-3 glasses of water. Dehydration may cause these contractions.   °¨ Do slow and deep breathing several times an hour.   °WHEN SHOULD I SEEK IMMEDIATE MEDICAL CARE? °Seek immediate medical care if: °· Your contractions become stronger, more regular, and closer together.   °· You have fluid leaking or gushing from your vagina.   °· You have a fever.   °· You pass blood-tinged mucus.   °· You have vaginal bleeding.   °· You have continuous abdominal pain.   °· You have low back pain that you never had before.   °· You feel your baby's head pushing down and causing pelvic pressure.   °· Your baby is not moving as much as it used to.   °Document Released: 12/29/2004 Document Revised: 01/03/2013 Document Reviewed: 10/10/2012 °Exit[employer]® Patient Information ©2015 [employer]. This information is not intended to replace advice given to you by your health care   provider. Make sure you discuss any questions you have with your health care provider. ° °

## 2014-02-22 NOTE — MAU Note (Signed)
Pt reports she had a SVE at her office visit today and since then she has had a brownish discharge and some watery discharge. Some lower back pain.

## 2014-02-27 ENCOUNTER — Encounter: Payer: Self-pay | Admitting: *Deleted

## 2014-02-27 DIAGNOSIS — Z3493 Encounter for supervision of normal pregnancy, unspecified, third trimester: Secondary | ICD-10-CM

## 2014-03-01 ENCOUNTER — Ambulatory Visit (INDEPENDENT_AMBULATORY_CARE_PROVIDER_SITE_OTHER): Payer: Self-pay | Admitting: Obstetrics & Gynecology

## 2014-03-01 VITALS — BP 133/77 | Wt 176.0 lb

## 2014-03-01 DIAGNOSIS — Z3491 Encounter for supervision of normal pregnancy, unspecified, first trimester: Secondary | ICD-10-CM

## 2014-03-01 DIAGNOSIS — Z3481 Encounter for supervision of other normal pregnancy, first trimester: Secondary | ICD-10-CM

## 2014-03-01 NOTE — Progress Notes (Signed)
Routine visit. Good FM. Labor precautions reviewed. 

## 2014-03-04 ENCOUNTER — Inpatient Hospital Stay (HOSPITAL_COMMUNITY): Payer: BLUE CROSS/BLUE SHIELD | Admitting: Anesthesiology

## 2014-03-04 ENCOUNTER — Encounter (HOSPITAL_COMMUNITY): Payer: Self-pay

## 2014-03-04 ENCOUNTER — Inpatient Hospital Stay (HOSPITAL_COMMUNITY)
Admission: AD | Admit: 2014-03-04 | Discharge: 2014-03-07 | DRG: 775 | Disposition: A | Discharge status: Home / Self Care | Payer: BLUE CROSS/BLUE SHIELD | Source: Ambulatory Visit | Attending: Obstetrics & Gynecology | Admitting: Obstetrics & Gynecology

## 2014-03-04 DIAGNOSIS — O4292 Full-term premature rupture of membranes, unspecified as to length of time between rupture and onset of labor: Secondary | ICD-10-CM | POA: Diagnosis not present

## 2014-03-04 DIAGNOSIS — Z3A39 39 weeks gestation of pregnancy: Secondary | ICD-10-CM | POA: Diagnosis present

## 2014-03-04 DIAGNOSIS — Z3403 Encounter for supervision of normal first pregnancy, third trimester: Secondary | ICD-10-CM | POA: Diagnosis present

## 2014-03-04 DIAGNOSIS — O4290 Premature rupture of membranes, unspecified as to length of time between rupture and onset of labor, unspecified weeks of gestation: Secondary | ICD-10-CM | POA: Diagnosis present

## 2014-03-04 LAB — CBC
HCT: 34.1 % — ABNORMAL LOW (ref 36.0–46.0)
HEMOGLOBIN: 11.4 g/dL — AB (ref 12.0–15.0)
MCH: 29.3 pg (ref 26.0–34.0)
MCHC: 33.4 g/dL (ref 30.0–36.0)
MCV: 87.7 fL (ref 78.0–100.0)
PLATELETS: 150 10*3/uL (ref 150–400)
RBC: 3.89 MIL/uL (ref 3.87–5.11)
RDW: 14.1 % (ref 11.5–15.5)
WBC: 9.5 10*3/uL (ref 4.0–10.5)

## 2014-03-04 LAB — POCT FERN TEST: POCT Fern Test: POSITIVE

## 2014-03-04 MED ORDER — PHENYLEPHRINE 40 MCG/ML (10ML) SYRINGE FOR IV PUSH (FOR BLOOD PRESSURE SUPPORT)
80.0000 ug | PREFILLED_SYRINGE | INTRAVENOUS | Status: DC | PRN
  Filled 2014-03-04: qty 2

## 2014-03-04 MED ORDER — OXYCODONE-ACETAMINOPHEN 5-325 MG PO TABS: 2.0000 | ORAL_TABLET | ORAL | Status: DC | PRN

## 2014-03-04 MED ORDER — ACETAMINOPHEN 325 MG PO TABS: 650.0000 mg | ORAL_TABLET | ORAL | Status: DC | PRN

## 2014-03-04 MED ORDER — PHENYLEPHRINE 40 MCG/ML (10ML) SYRINGE FOR IV PUSH (FOR BLOOD PRESSURE SUPPORT)
80.0000 ug | PREFILLED_SYRINGE | INTRAVENOUS | Status: DC | PRN
Start: 2014-03-04 — End: 2014-03-05
  Administered 2014-03-04: 80 ug via INTRAVENOUS
  Filled 2014-03-04: qty 2

## 2014-03-04 MED ORDER — FENTANYL 2.5 MCG/ML BUPIVACAINE 1/10 % EPIDURAL INFUSION (WH - ANES)
INTRAMUSCULAR | Status: AC
  Filled 2014-03-04: qty 125

## 2014-03-04 MED ORDER — MISOPROSTOL 50MCG HALF TABLET
50.0000 ug | ORAL_TABLET | ORAL | Status: DC | PRN
  Administered 2014-03-04: 50 ug via ORAL
  Filled 2014-03-04 (×2): qty 1

## 2014-03-04 MED ORDER — FENTANYL CITRATE 0.05 MG/ML IJ SOLN
100.0000 ug | INTRAMUSCULAR | Status: DC | PRN
  Administered 2014-03-04: 100 ug via INTRAVENOUS
  Filled 2014-03-04: qty 2

## 2014-03-04 MED ORDER — TERBUTALINE SULFATE 1 MG/ML IJ SOLN: 0.2500 mg | Freq: Once | INTRAMUSCULAR | Status: AC | PRN

## 2014-03-04 MED ORDER — LACTATED RINGERS IV SOLN
INTRAVENOUS | Status: DC
  Administered 2014-03-04 (×2): via INTRAVENOUS

## 2014-03-04 MED ORDER — FENTANYL 2.5 MCG/ML BUPIVACAINE 1/10 % EPIDURAL INFUSION (WH - ANES)
INTRAMUSCULAR | Status: DC | PRN
  Administered 2014-03-04: 14 mL/h via EPIDURAL

## 2014-03-04 MED ORDER — OXYTOCIN 40 UNITS IN LACTATED RINGERS INFUSION - SIMPLE MED
62.5000 mL/h | INTRAVENOUS | Status: DC
  Filled 2014-03-04: qty 1000

## 2014-03-04 MED ORDER — ONDANSETRON HCL 4 MG/2ML IJ SOLN: 4.0000 mg | Freq: Four times a day (QID) | INTRAMUSCULAR | Status: DC | PRN

## 2014-03-04 MED ORDER — EPHEDRINE 5 MG/ML INJ
10.0000 mg | INTRAVENOUS | Status: DC | PRN
  Filled 2014-03-04: qty 2

## 2014-03-04 MED ORDER — LACTATED RINGERS IV SOLN: 500.0000 mL | INTRAVENOUS | Status: DC | PRN

## 2014-03-04 MED ORDER — DIPHENHYDRAMINE HCL 50 MG/ML IJ SOLN
12.5000 mg | INTRAMUSCULAR | Status: DC | PRN
  Administered 2014-03-04: 12.5 mg via INTRAVENOUS
  Filled 2014-03-04: qty 1

## 2014-03-04 MED ORDER — FENTANYL 2.5 MCG/ML BUPIVACAINE 1/10 % EPIDURAL INFUSION (WH - ANES): 14.0000 mL/h | INTRAMUSCULAR | Status: DC | PRN

## 2014-03-04 MED ORDER — LACTATED RINGERS IV SOLN: 500.0000 mL | Freq: Once | INTRAVENOUS | Status: DC

## 2014-03-04 MED ORDER — CITRIC ACID-SODIUM CITRATE 334-500 MG/5ML PO SOLN: 30.0000 mL | ORAL | Status: DC | PRN

## 2014-03-04 MED ORDER — LIDOCAINE HCL (PF) 1 % IJ SOLN
30.0000 mL | INTRAMUSCULAR | Status: DC | PRN
  Filled 2014-03-04: qty 30

## 2014-03-04 MED ORDER — PHENYLEPHRINE 40 MCG/ML (10ML) SYRINGE FOR IV PUSH (FOR BLOOD PRESSURE SUPPORT)
PREFILLED_SYRINGE | INTRAVENOUS | Status: AC
  Filled 2014-03-04: qty 20

## 2014-03-04 MED ORDER — LIDOCAINE HCL (PF) 1 % IJ SOLN
INTRAMUSCULAR | Status: DC | PRN
  Administered 2014-03-04 (×2): 5 mL
  Administered 2014-03-04: 3 mL

## 2014-03-04 MED ORDER — OXYCODONE-ACETAMINOPHEN 5-325 MG PO TABS: 1.0000 | ORAL_TABLET | ORAL | Status: DC | PRN

## 2014-03-04 MED ORDER — OXYTOCIN BOLUS FROM INFUSION: 500.0000 mL | INTRAVENOUS | Status: DC

## 2014-03-04 NOTE — H&P (Signed)
LABOR ADMISSION HISTORY AND PHYSICAL  Lindsey Fuller is a 29 y.o. female G1P0000 with IUP at 564w1d by LMP presenting for after rupture of membranes early this morning. She reports +FMs, No LOF, no VB, no blurry vision, headaches or peripheral edema, and RUQ pain.  She plans on breast feeding. She is undecided for birth control but does not want to conceive for at least ?2 years.  Dating: By LMP --->  Estimated Date of Delivery: 03/10/14     Prenatal History/Complications:  Past Medical History: Past Medical History  Diagnosis Date  . Weakness   . Anemia   . Allergy   . Abnormal Pap smear of cervix     Past Surgical History: Past Surgical History  Procedure Laterality Date  . No past surgeries      Obstetrical History: OB History    Gravida Para Term Preterm AB TAB SAB Ectopic Multiple Living   1 0 0 0 0 0 0 0 0 0       Social History: History   Social History  . Marital Status: Married    Spouse Name: Sande BrothersValon Buschman  . Number of Children: N/A  . Years of Education: BA   Occupational History  . Student    Social History Main Topics  . Smoking status: Never Smoker   . Smokeless tobacco: Never Used  . Alcohol Use: No  . Drug Use: No  . Sexual Activity:    Partners: Male     Comment: runs, goes to the gymn 3 X week, student BS degree   Other Topics Concern  . None   Social History Narrative   2 caffeinated drinks daily.  Exercises regulalry.      Family History: History reviewed. No pertinent family history.  Allergies: Allergies  Allergen Reactions  . Asa Buff (Mag [Buffered Aspirin] Rash    Prescriptions prior to admission  Medication Sig Dispense Refill Last Dose  . calcium carbonate (TUMS - DOSED IN MG ELEMENTAL CALCIUM) 500 MG chewable tablet Chew 1 tablet by mouth daily as needed for indigestion or heartburn.   Past Week at Unknown time  . IRON PO Take 1 tablet by mouth 2 (two) times daily.   03/03/2014 at Unknown time  . Prenat-Fe  Carbonyl-FA-Omega 3 (ONE-A-DAY WOMENS PRENATAL 1) 28-0.8-235 MG CAPS Take 1 tablet by mouth daily.    03/03/2014 at Unknown time  . sodium chloride (OCEAN) 0.65 % SOLN nasal spray Place 1 spray into both nostrils as needed for congestion.   Past Week at Unknown time     Review of Systems   All systems reviewed and negative except as stated in HPI  Blood pressure 125/84, pulse 110, temperature 98.7 F (37.1 C), temperature source Oral, resp. rate 16, height 5\' 6"  (1.676 m), weight 174 lb 4 oz (79.039 kg), last menstrual period 06/03/2013. General appearance: alert and cooperative Lungs: clear to auscultation bilaterally Heart: regular rate and rhythm Abdomen: soft, non-tender; bowel sounds normal Extremities: Homans sign is negative, no sign of DVT Presentation: cephalic Fetal monitoringBaseline: 130 bpm, Variability: Good {> 6 bpm), Accelerations: Reactive and Decelerations: Absent Uterine activityq6-47min  Dilation: 2 Effacement (%): 70, 60 Station: 0 Exam by:: Sharen Hintaroline Brewer RN   Prenatal labs: ABO, Rh: A/POS/-- (07/13 1127) Antibody: NEG (07/13 1127) Rubella:   RPR: NON REAC (12/03 1506)  HBsAg: NEGATIVE (07/13 1127)  HIV: NONREACTIVE (12/03 1506)  GBS:    1 hr Glucola 124 Genetic screening  Neg Quad Anatomy US marginal cord insertion otherwise  normal    Clinic KV  Dating LMP/7.2 weeks [place].   Genetic Screen  Quad: Neg             NIPS:  Anatomic [place] Nml; marginal cord insertion (no further testing/eval needed)  GTT  Third trimester: 124  TDaP vaccine  12/15  Flu vaccine  done at work  GBS neg  Contraception OCPs  Baby Food  breast  Circumcision yes  Pediatrician   Support Person Valon      Results for orders placed or performed during the hospital encounter of 03/04/14 (from the past 24 hour(s))  [employer] Time: 03/04/14 11:29 AM  Result Value Ref Range   POCT Fern Test Positive = ruptured amniotic membanes     Patient Active Problem List    Diagnosis Date Noted  . Atypical squamous cell changes of undetermined significance (ASCUS) on cervical cytology with positive high risk human papilloma virus (HPV) 07/24/2013  . Supervision of normal pregnancy in first trimester 07/24/2013  . Seasonal allergies 04/06/2012  . Nasal congestion 04/06/2012    Assessment: Lindsey Fuller is a 29 y.o. G1P0000 at [redacted]w[redacted]d here for rupture of membranes  #Labor: once on L&D will check, likely start pitocin vs cytotec #Pain: Fentanyl prn q1h, epidural once dilated to 4cm or mroe #FWB: Cat I #ID:  GBS neg #MOF: breast #MOC: OCPs  Hatcher Froning [name] 03/04/2014, 11:58 AM

## 2014-03-04 NOTE — Progress Notes (Signed)
Called to notify of pt arrival in MAU and positive fern. Dr. Loreta AveAcosta in delivery and will call back

## 2014-03-04 NOTE — Progress Notes (Signed)
LABOR PROGRESS NOTE  Lindsey Fuller is a 29 y.o. G1P0000 at 1740w1d  admitted for PROM  Subjective: comfortable  Objective: BP 122/71 mmHg  Pulse 84  Temp(Src) 98.1 F (36.7 C) (Oral)  Resp 18  Ht 5\' 6"  (1.676 m)  Wt 174 lb 4 oz (79.039 kg)  BMI 28.14 kg/m2  LMP 06/03/2013 or  Filed Vitals:   03/04/14 1113 03/04/14 1124 03/04/14 1648  BP:  125/84 122/71  Pulse:  110 84  Temp:  98.7 F (37.1 C) 98.1 F (36.7 C)  TempSrc:  Oral Oral  Resp:  16 18  Height: 5\' 6"  (1.676 m)    Weight: 174 lb 4 oz (79.039 kg)       Dilation: 2 Effacement (%): 90 Cervical Position: Middle Station: -2 Presentation: Vertex Exam by:: Loreta AveAcosta  Labs: Lab Results  Component Value Date   WBC 9.5 03/04/2014   HGB 11.4* 03/04/2014   HCT 34.1* 03/04/2014   MCV 87.7 03/04/2014   PLT 150 03/04/2014    Patient Active Problem List   Diagnosis Date Noted  . Premature rupture of membranes 03/04/2014  . Atypical squamous cell changes of undetermined significance (ASCUS) on cervical cytology with positive high risk human papilloma virus (HPV) 07/24/2013  . Supervision of normal pregnancy in first trimester 07/24/2013  . Seasonal allergies 04/06/2012  . Nasal congestion 04/06/2012    Assessment / Plan: 29 y.o. G1P0000 at 7140w1d here for PROM  Labor: will give cytotec x 1 as pt would like to eat dinner, delay in starting augmentation 2/2 low nursing staff ==> delayed transfer from MAU to BS Fetal Wellbeing:  Cat I Pain Control:  Epidural upon request Anticipated MOD:  SVD  Apollos Tenbrink [name], MD 03/04/2014, 5:56 PM

## 2014-03-04 NOTE — Anesthesia Procedure Notes (Signed)
Epidural Patient location during procedure: OB  Staffing Anesthesiologist: Istvan Behar Performed by: anesthesiologist   Preanesthetic Checklist Completed: patient identified, site marked, surgical consent, pre-op evaluation, timeout performed, IV checked, risks and benefits discussed and monitors and equipment checked  Epidural Patient position: sitting Prep: ChloraPrep Patient monitoring: heart rate, continuous pulse ox and blood pressure Approach: right paramedian Location: L3-L4 Injection technique: LOR saline  Needle:  Needle type: Tuohy  Needle gauge: 17 G Needle length: 9 cm and 9 Needle insertion depth: 6 cm Catheter type: closed end flexible Catheter size: 20 Guage Catheter at skin depth: 11 cm Test dose: negative  Assessment Events: blood not aspirated, injection not painful, no injection resistance, negative IV test and no paresthesia  Additional Notes   Patient tolerated the insertion well without complications.   

## 2014-03-04 NOTE — MAU Note (Signed)
Pt to 164

## 2014-03-04 NOTE — Progress Notes (Signed)
Pt feeling nausea, head of bed layed down, vs wnl.  Will continue to asses b/p

## 2014-03-04 NOTE — MAU Note (Signed)
HMitchell, Rn charge given pt report.

## 2014-03-04 NOTE — Anesthesia Preprocedure Evaluation (Signed)

## 2014-03-04 NOTE — Progress Notes (Signed)
Pt feeling very naseus, bolus started, meds given see mar.  Pt in suprine position.

## 2014-03-04 NOTE — Progress Notes (Signed)
Dr. Loreta AveAcosta called back for report on pt. Will put in admit orders

## 2014-03-05 ENCOUNTER — Encounter (HOSPITAL_COMMUNITY): Payer: Self-pay | Admitting: *Deleted

## 2014-03-05 DIAGNOSIS — Z3A39 39 weeks gestation of pregnancy: Secondary | ICD-10-CM

## 2014-03-05 DIAGNOSIS — O4292 Full-term premature rupture of membranes, unspecified as to length of time between rupture and onset of labor: Secondary | ICD-10-CM

## 2014-03-05 LAB — CBC
HEMATOCRIT: 32.6 % — AB (ref 36.0–46.0)
HEMOGLOBIN: 11 g/dL — AB (ref 12.0–15.0)
MCH: 29.6 pg (ref 26.0–34.0)
MCHC: 33.7 g/dL (ref 30.0–36.0)
MCV: 87.9 fL (ref 78.0–100.0)
Platelets: 132 10*3/uL — ABNORMAL LOW (ref 150–400)
RBC: 3.71 MIL/uL — ABNORMAL LOW (ref 3.87–5.11)
RDW: 14.2 % (ref 11.5–15.5)
WBC: 16.1 10*3/uL — ABNORMAL HIGH (ref 4.0–10.5)

## 2014-03-05 LAB — RPR: RPR Ser Ql: NONREACTIVE

## 2014-03-05 MED ORDER — PRENATAL MULTIVITAMIN CH
1.0000 | ORAL_TABLET | Freq: Every day | ORAL | Status: DC
  Administered 2014-03-05 – 2014-03-06 (×2): 1 via ORAL
  Filled 2014-03-05 (×2): qty 1

## 2014-03-05 MED ORDER — ONDANSETRON HCL 4 MG PO TABS: 4.0000 mg | ORAL_TABLET | ORAL | Status: DC | PRN

## 2014-03-05 MED ORDER — ZOLPIDEM TARTRATE 5 MG PO TABS: 5.0000 mg | ORAL_TABLET | Freq: Every evening | ORAL | Status: DC | PRN

## 2014-03-05 MED ORDER — TERBUTALINE SULFATE 1 MG/ML IJ SOLN
0.2500 mg | Freq: Once | INTRAMUSCULAR | Status: DC | PRN
  Filled 2014-03-05: qty 1

## 2014-03-05 MED ORDER — DIPHENHYDRAMINE HCL 25 MG PO CAPS: 25.0000 mg | ORAL_CAPSULE | Freq: Four times a day (QID) | ORAL | Status: DC | PRN

## 2014-03-05 MED ORDER — ONDANSETRON HCL 4 MG/2ML IJ SOLN: 4.0000 mg | INTRAMUSCULAR | Status: DC | PRN

## 2014-03-05 MED ORDER — OXYCODONE-ACETAMINOPHEN 5-325 MG PO TABS: 2.0000 | ORAL_TABLET | ORAL | Status: DC | PRN

## 2014-03-05 MED ORDER — TETANUS-DIPHTH-ACELL PERTUSSIS 5-2.5-18.5 LF-MCG/0.5 IM SUSP: 0.5000 mL | Freq: Once | INTRAMUSCULAR | Status: DC

## 2014-03-05 MED ORDER — SIMETHICONE 80 MG PO CHEW: 80.0000 mg | CHEWABLE_TABLET | ORAL | Status: DC | PRN

## 2014-03-05 MED ORDER — OXYTOCIN 40 UNITS IN LACTATED RINGERS INFUSION - SIMPLE MED
1.0000 m[IU]/min | INTRAVENOUS | Status: DC
  Administered 2014-03-05: 2 m[IU]/min via INTRAVENOUS

## 2014-03-05 MED ORDER — DIBUCAINE 1 % RE OINT: 1.0000 "application " | TOPICAL_OINTMENT | RECTAL | Status: DC | PRN

## 2014-03-05 MED ORDER — BENZOCAINE-MENTHOL 20-0.5 % EX AERO
1.0000 "application " | INHALATION_SPRAY | CUTANEOUS | Status: DC | PRN
  Administered 2014-03-06: 1 via TOPICAL
  Filled 2014-03-05: qty 56

## 2014-03-05 MED ORDER — OXYCODONE-ACETAMINOPHEN 5-325 MG PO TABS: 1.0000 | ORAL_TABLET | ORAL | Status: DC | PRN

## 2014-03-05 MED ORDER — LANOLIN HYDROUS EX OINT: TOPICAL_OINTMENT | CUTANEOUS | Status: DC | PRN

## 2014-03-05 MED ORDER — IBUPROFEN 600 MG PO TABS
600.0000 mg | ORAL_TABLET | Freq: Four times a day (QID) | ORAL | Status: DC
  Administered 2014-03-05 – 2014-03-07 (×8): 600 mg via ORAL
  Filled 2014-03-05 (×9): qty 1

## 2014-03-05 MED ORDER — WITCH HAZEL-GLYCERIN EX PADS: 1.0000 "application " | MEDICATED_PAD | CUTANEOUS | Status: DC | PRN

## 2014-03-05 MED ORDER — SENNOSIDES-DOCUSATE SODIUM 8.6-50 MG PO TABS
2.0000 | ORAL_TABLET | ORAL | Status: DC
  Administered 2014-03-05 – 2014-03-06 (×2): 2 via ORAL
  Filled 2014-03-05 (×2): qty 2

## 2014-03-05 NOTE — Anesthesia Postprocedure Evaluation (Signed)
  Anesthesia Post-op Note  Anesthesia Post Note  Patient: Lindsey Fuller  Procedure(s) Performed: * No procedures listed *  Anesthesia type: Epidural  Patient location: Mother/Baby  Post pain: Pain level controlled  Post assessment: Post-op Vital signs reviewed  Last Vitals:  Filed Vitals:   03/05/14 0509  BP: 115/63  Pulse: 97  Temp:   Resp: 18    Post vital signs: Reviewed  Level of consciousness:alert  Complications: No apparent anesthesia complications

## 2014-03-05 NOTE — Progress Notes (Signed)
Patient ID: Lindsey Fuller, female   DOB: Oct 18, 1985, 29 y.o.   MRN: [id number] Filed Vitals:   03/05/14 0130 03/05/14 0139 03/05/14 0201 03/05/14 0214  BP: 112/59  96/66   Pulse: 66  75   Temp:  99.6 F (37.6 C)    TempSrc:  Oral    Resp: 18   18  Height:      Weight:      SpO2:        FHR reactive UCs every 3 min  Dilation: 9 Effacement (%): 100 Cervical Position: Middle Station: 0 Presentation: Vertex Exam by:: a. harper, rnc-ob  Will await second stage

## 2014-03-05 NOTE — Lactation Note (Signed)
This note was copied from the chart of Boy Caleen JobsBrikene Segal. Lactation Consultation Note;  Lactation Brochure given with basic teaching done. This is mothers first child. Infant is 2811 hours old and has had several feedings. Mother was fit with a #20 nipple shield this am by Staff nurse. Mothers nipples are pink and very  tender bilaterally. Mother has bilateral dimpled nipples. She is semi flat. Mother taught hans expression and breast massage. Observed a few drops of colostrum. Infant latched on the Left breast in football hold for 7 mins.without the nipple shield.  Observed a good pattern of suckling. Mother describes #2 pain. Mother taught cross cradle hold. Infant was able to grasp breast with wider gape. Observed 10-15 mins with a few audible swallows. Infant then latched to Rt breast for another 10-15 mins. Mother was fit with a #24 nipple shield. Mother chooses not to use the nipple shield at this time. Advised to use is she continues to have difficulty with latch. Mother taught how to apply the nipple shield. Mother was also given comfort gels.  Parents advised in cue base feeding, encouraged to do frequent STS and discussed cluster feeding. Mother informed of available LC services and community support.   Patient Name: Boy Caleen JobsBrikene Banh ZOXWR'UToday's Date: 03/05/2014 Reason for consult: Follow-up assessment   Maternal Data Has patient been taught Hand Expression?: Yes Does the patient have breastfeeding experience prior to this delivery?: No  Feeding Feeding Type: Breast Fed Length of feed: 15 min  LATCH Score/Interventions Latch: Grasps breast easily, tongue down, lips flanged, rhythmical sucking. Intervention(s): Adjust position;Assist with latch;Breast compression  Audible Swallowing: A few with stimulation Intervention(s): Skin to skin;Hand expression Intervention(s): Skin to skin;Hand expression  Type of Nipple: Flat  Comfort (Breast/Nipple): Soft / non-tender     Hold  (Positioning): Assistance needed to correctly position infant at breast and maintain latch. Intervention(s): Breastfeeding basics reviewed;Support Pillows;Position options;Skin to skin  LATCH Score: 7  Lactation Tools Discussed/Used     Consult Status Consult Status: Follow-up Date: 03/05/14 Follow-up type: In-patient    Stevan BornKendrick, Kailene Steinhart [facility] 03/05/2014, 3:56 PM

## 2014-03-06 NOTE — Lactation Note (Signed)
This note was copied from the chart of Lindsey Fuller. Lactation ConsCaleen Jobsultation Note Mom called out for assistance for latching. RN gave formula d/t baby cluster feeding and mom's nipples are hurting. Mom hasn't been using NS, she has very short shaft to RT. Nipple, w/inversion in center, appears raw w/raspberry look until softens then looks slightly inverted in center. Lt. Nipple slightly larger w/#20NS fitting well. RN gave shells, encouraged to wear in bra to assist in everting nipples. Encouraged to wear NS during BF d/t will not get milk transfer and also injury to nipples. Mom tearful, dad doesn't want baby to have formula, only BM. Hand expression w/only glistening of colostrum. Discussed cluster feeding.  Encouraged mom to call for next feeding to assist in latching wearing appropriate size of NS.  Patient Name: Lindsey Caleen JobsBrikene Wooden ZOXWR'UToday's Date: 03/06/2014 Reason for consult: Follow-up assessment;Difficult latch;Breast/nipple pain   Maternal Data    Feeding Feeding Type: Bottle Fed - Formula Length of feed: 10 min  LATCH Score/Interventions Latch: Grasps breast easily, tongue down, lips flanged, rhythmical sucking. Intervention(s): Adjust position;Assist with latch  Audible Swallowing: A few with stimulation Intervention(s): Skin to skin  Type of Nipple: Everted at rest and after stimulation  Comfort (Breast/Nipple): Soft / non-tender  Problem noted: Cracked, bleeding, blisters, bruises Interventions  (Cracked/bleeding/bruising/blister): Double electric pump Interventions (Mild/moderate discomfort): Comfort gels;Hand expression;Hand massage;Breast shields;Post-pump  Hold (Positioning): Assistance needed to correctly position infant at breast and maintain latch. Intervention(s): Skin to skin;Position options;Support Pillows;Breastfeeding basics reviewed  LATCH Score: 8  Lactation Tools Discussed/Used Tools: Shells;Nipple Shields;Pump;Comfort gels Nipple shield size:  16;20 Shell Type: Inverted Breast pump type: Double-Electric Breast Pump Initiated by:: RN   Consult Status Consult Status: Follow-up Date: 03/06/14 (pm) Follow-up type: In-patient    Charyl DancerCARVER, Yolande Skoda G 03/06/2014, 1:26 PM

## 2014-03-06 NOTE — Progress Notes (Signed)
Post Partum Day 1 Subjective: Pt is a 28y/o G1P1001 PPD#1 after a NSVD@0348  at 3226w2d EDC by LMP on 03/05/13. She reports no complications overnight. No nausea vomiting. No BM or passing of flatus. Pain is under control. Ambulating well. Feeding: Breast. Contraception: Progestin only. Opting against circumcision for her son. No other complaints.  Objective: Blood pressure 113/74, pulse 69, temperature 97.8 F (36.6 C), temperature source Oral, resp. rate 18, height 5\' 6"  (1.676 m), weight 79.039 kg (174 lb 4 oz), last menstrual period 06/03/2013, SpO2 99 %, unknown if currently breastfeeding.  Physical Exam:  General: alert, cooperative and no distress  Heart: RRR Lungs: CTA Lochia: appropriate Uterine Fundus: firm Incision: N/A DVT Evaluation: No evidence of DVT seen on physical exam.   Recent Labs  03/04/14 1205 03/05/14 0645  HGB 11.4* 11.0*  HCT 34.1* 32.6*    Assessment/Plan: Plan for discharge tomorrow  Obtain Pap Smear due to ASCUS reported on 12/14   LOS: 2 days     Eston Estersyler M Mohr, Student-PA 03/06/2014 at 7:31 AM   OB fellow attestation Post Partum Day 1 I have seen and examined this patient and agree with above documentation in the student's note.   Caleen JobsBrikene Utter is a 29 y.o. G1P1001 s/p NSVD.  Pt denies problems with ambulating, voiding or po intake. Pain is well controlled.  Plan for birth control is oral progesterone-only contraceptive.  Method of Feeding: breast  PE:  BP 113/74 mmHg  Pulse 69  Temp(Src) 97.8 F (36.6 C) (Oral)  Resp 18  Ht 5\' 6"  (1.676 m)  Wt 174 lb 4 oz (79.039 kg)  BMI 28.14 kg/m2  SpO2 99%  LMP 06/03/2013  Breastfeeding? Unknown Fundus firm  Plan for discharge: PPD#2  William DaltonMcEachern, Rohaan Durnil, MD 8:37 AM

## 2014-03-06 NOTE — Lactation Note (Signed)
This note was copied from the chart of Lindsey Fuller. Lactation Consultation Note: Mother paged for assistance with latch. When I arrived mother had self latched infant on the Left breast in football hold. Observed infant with good depth and frequent suckling with intermittent swallows. Mother is not using the nipple shield. She denies painful latch. Her nipples are healing. Mother advised to post pump for 15 mins and offer EBM as needed with spoon or curved tip syringe. Parents very receptive to teaching plan. Staff nurse to assist mother with pumping. Mother to page as needed. Discussed continued cue base feeding.   Patient Name: Lindsey Fuller Lindsey Fuller Reason for consult: Follow-up assessment   Maternal Data    Feeding Feeding Type: Breast Fed Length of feed: 10 min  LATCH Score/Interventions Latch: Grasps breast easily, tongue down, lips flanged, rhythmical sucking. Intervention(s): Adjust position;Assist with latch  Audible Swallowing: A few with stimulation Intervention(s): Skin to skin  Type of Nipple: Everted at rest and after stimulation  Comfort (Breast/Nipple): Soft / non-tender  Problem noted: Cracked, bleeding, blisters, bruises;Mild/Moderate discomfort Interventions (Mild/moderate discomfort): Comfort gels  Hold (Positioning): Assistance needed to correctly position infant at breast and maintain latch. Intervention(s): Support Pillows;Position options;Skin to skin  LATCH Score: 8  Lactation Tools Discussed/Used     Consult Status Consult Status: Follow-up Date: 03/06/14 Follow-up type: In-patient    Lindsey Fuller, Lindsey Fuller, 10:59 AM

## 2014-03-06 NOTE — Lactation Note (Signed)
This note was copied from the chart of Lindsey Fuller. Lactation Consultation Note Returned to rm. To assist in latching. Baby sleepy. Rm. Temp 74, mom clammy sweaty, baby in sleeper, opened sleeper to have STS. Applied #20NS to Lt. Nipple. Latched well. Needed much stimulation to suckle. Mom frustrated, and took off NS, baby rooting and wouldn't latch, short shaft nipple makes unable to obtain a deep latch and baby frustrated. Mom states is this because I have a short shaft nipple and baby can't feel my nipple. I stated I think so. She laid the NS on her nipple. I demonstrated proper application again and latched baby. Baby will do a couple of sucks and sit there. Discussed normal newborn behavior.  Patient Name: Lindsey Caleen JobsBrikene Biscoe ZOXWR'UToday's Date: 03/06/2014 Reason for consult: Difficult latch;Breast/nipple pain   Maternal Data    Feeding Feeding Type: Breast Fed Length of feed: 10 min  LATCH Score/Interventions Latch: Repeated attempts needed to sustain latch, nipple held in mouth throughout feeding, stimulation needed to elicit sucking reflex. Intervention(s): Adjust position;Assist with latch;Breast massage;Breast compression  Audible Swallowing: None Intervention(s): Skin to skin;Hand expression Intervention(s): Alternate breast massage  Type of Nipple: Everted at rest and after stimulation (short shaft) Intervention(s): Shells;Double electric pump  Comfort (Breast/Nipple): Filling, red/small blisters or bruises, mild/mod discomfort  Problem noted: Mild/Moderate discomfort Interventions  (Cracked/bleeding/bruising/blister): Double electric pump;Expressed breast milk to nipple Interventions (Mild/moderate discomfort): Comfort gels;Breast shields  Hold (Positioning): Assistance needed to correctly position infant at breast and maintain latch. Intervention(s): Skin to skin;Position options;Support Pillows;Breastfeeding basics reviewed  LATCH Score: 5  Lactation Tools  Discussed/Used Tools: Shells;Nipple Shields;Comfort gels;Pump Nipple shield size: 20;16 Shell Type: Inverted Breast pump type: Double-Electric Breast Pump Initiated by:: RN   Consult Status Consult Status: Follow-up Date:  (03/07/2014) Follow-up type: In-patient    Lindsey Fuller, Lindsey Fuller 03/06/2014, 4:54 PM

## 2014-03-07 MED ORDER — IBUPROFEN 600 MG PO TABS: 600.0000 mg | ORAL_TABLET | Freq: Four times a day (QID) | ORAL | Status: DC

## 2014-03-07 NOTE — Discharge Summary (Signed)
Obstetric Discharge Summary Reason for Admission: onset of labor Prenatal Procedures: ultrasound Intrapartum Procedures: spontaneous vaginal delivery Postpartum Procedures: none Complications-Operative and Postpartum: none HEMOGLOBIN  Date Value Ref Range Status  03/05/2014 11.0* 12.0 - 15.0 g/dL Final   HCT  Date Value Ref Range Status  03/05/2014 32.6* 36.0 - 46.0 % Final    Physical Exam:  General: alert, cooperative and no distress Lochia: appropriate Uterine Fundus: firm Incision: n/a DVT Evaluation: No evidence of DVT seen on physical exam. Negative Homan's sign. No cords or calf tenderness. No significant calf/ankle edema.  Discharge Diagnoses: Term Pregnancy-delivered Delivery Note on 03/05/14: At 3:48 AM a viable and healthy female was delivered via Vaginal, Spontaneous Delivery (Presentation: ROA ). APGAR: 9, 9; weight pending .  Placenta status: Intact, Spontaneous. Cord: 3 vessels with the following complications: None.   Anesthesia: Epidural  Episiotomy: None Lacerations: None Suture Repair: n/a Est. Blood Loss (mL): 200  Vigorous female infant delivered over intact perineum. Immediate cry, placed on mother's abdomen.  Mom to postpartum. Baby to Couplet care / Skin to Skin.  Discharge Information: Date: 03/07/2014 Activity: pelvic rest Diet: routine Medications: PNV and Ibuprofen Condition: stable Instructions: refer to practice specific booklet Discharge to: home   Newborn Data: Live born female  Birth Weight: 8 lb 3.9 oz (3740 g) APGAR: 9, 9  Home with mother.  LEFTWICH-KIRBY, Carolin Quang 03/07/2014, 9:18 AM

## 2014-03-07 NOTE — Lactation Note (Signed)
This note was copied from the chart of Boy Caleen JobsBrikene Sorce. Lactation Consultation Note  Mother has been wearing shells to help evert nipples.  Right has crack, old blood and left cracked, new blood. Mother states she has pumped 2x and got nothing so started supplementing because she has "no milk" Reviewed supply and demand and the importance of stimulating her breasts.  If she is too sore to bf then she can pump. Reviewed pumped at least 4-6 times a day for 15-20 min and up to 8 times if baby is not latching. Baby has visible posterior frenulum that is contributing to some limited movement of the tongue. Attempted latching and reviewed various positions.  Mother prefers football hold and plans to continue to use NS. Offered outpt appt and mother states she will call. She has DEBP at home. Provided mother w/ #20NS #24NS and another set of comfort gels.   Suggest she hand express and apply ebm for soreness also. Reviewed engorgement care.  Monitoring voids/stools.  Patient Name: Boy Caleen JobsBrikene Riquelme ZOXWR'UToday's Date: 03/07/2014 Reason for consult: Follow-up assessment   Maternal Data    Feeding Feeding Type: Breast Fed Length of feed: 10 min (off and on)  LATCH Score/Interventions                      Lactation Tools Discussed/Used     Consult Status Consult Status: Complete    Hardie PulleyBerkelhammer, Ruth Boschen 03/07/2014, 9:34 AM

## 2014-03-08 ENCOUNTER — Telehealth: Payer: Self-pay | Admitting: *Deleted

## 2014-03-08 ENCOUNTER — Encounter: Payer: BLUE CROSS/BLUE SHIELD | Admitting: Obstetrics & Gynecology

## 2014-03-08 DIAGNOSIS — IMO0001 Reserved for inherently not codable concepts without codable children: Secondary | ICD-10-CM

## 2014-03-08 MED ORDER — IBUPROFEN 800 MG PO TABS: 800.0000 mg | ORAL_TABLET | Freq: Three times a day (TID) | ORAL | Status: DC | PRN

## 2014-03-08 NOTE — Telephone Encounter (Signed)
Pt called stating that she would like something stronger for her post delivery pain.  Explained that we do not give narcotics for a vaginal birth but could give Ibuprofen 800mg  every 6 hours per Dr Marice Potterove.

## 2014-03-16 ENCOUNTER — Other Ambulatory Visit: Payer: Self-pay | Admitting: Obstetrics & Gynecology

## 2014-03-21 ENCOUNTER — Other Ambulatory Visit: Payer: Self-pay | Admitting: *Deleted

## 2014-03-21 DIAGNOSIS — R52 Pain, unspecified: Secondary | ICD-10-CM

## 2014-03-21 MED ORDER — IBUPROFEN 600 MG PO TABS: 600.0000 mg | ORAL_TABLET | Freq: Four times a day (QID) | ORAL | Status: DC

## 2014-03-21 NOTE — Telephone Encounter (Signed)
Pt called stating that she is running a temp with chills and body aches.  Pt denies breast tenderness.  Encouraged Ibuprofen round the clock and will see her in office in AM if not any improvements

## 2014-03-22 ENCOUNTER — Encounter: Payer: Self-pay | Admitting: Obstetrics & Gynecology

## 2014-03-22 ENCOUNTER — Ambulatory Visit (INDEPENDENT_AMBULATORY_CARE_PROVIDER_SITE_OTHER): Payer: BLUE CROSS/BLUE SHIELD | Admitting: Obstetrics & Gynecology

## 2014-03-22 VITALS — BP 120/69 | HR 69 | Resp 16 | Ht 66.0 in | Wt 155.0 lb

## 2014-03-22 DIAGNOSIS — O9123 Nonpurulent mastitis associated with lactation: Secondary | ICD-10-CM | POA: Diagnosis not present

## 2014-03-22 DIAGNOSIS — N61 Inflammatory disorders of breast: Secondary | ICD-10-CM

## 2014-03-22 MED ORDER — CEPHALEXIN 500 MG PO CAPS: 500.0000 mg | ORAL_CAPSULE | Freq: Four times a day (QID) | ORAL | Status: DC

## 2014-03-22 NOTE — Progress Notes (Signed)
Patient ID: Lindsey Fuller, female   DOB: [birth year], 29 y.o.   MRN: [id number]   HPI:  Pt is 3 weeks post partum and presents with c/o's of fevers yesterday and body aches.  Denies engorgement.  Pt took ibuprofen 600 mg which helped.  Pt is pumping q2-3 hours and using nipple shield.    Filed Vitals:   03/22/14 0816  BP: 120/69  Pulse: 69  Resp: 16  Height: 5\' 6"  (1.676 m)  Weight: 155 lb (70.308 kg)   General:  Well developed, well nourishe, NAD NCAT Pulm:  Nml effirt Breast:  Left breast is red from 5-7 o'clock below nipple.  Tender to touch.  No fluctuant area Abd: soft, NT Skin:  Warm, dry Psych:  Nml affect and mood  A/p  Left breast mastitis.  Keflex 500 qid Ibuprofen If not better by 10 am tomorrow, call office.

## 2014-04-16 ENCOUNTER — Ambulatory Visit (INDEPENDENT_AMBULATORY_CARE_PROVIDER_SITE_OTHER): Payer: BLUE CROSS/BLUE SHIELD | Admitting: Obstetrics & Gynecology

## 2014-04-16 ENCOUNTER — Encounter: Payer: Self-pay | Admitting: Obstetrics & Gynecology

## 2014-04-16 DIAGNOSIS — Z1151 Encounter for screening for human papillomavirus (HPV): Secondary | ICD-10-CM

## 2014-04-16 DIAGNOSIS — Z0000 Encounter for general adult medical examination without abnormal findings: Secondary | ICD-10-CM

## 2014-04-16 DIAGNOSIS — Z124 Encounter for screening for malignant neoplasm of cervix: Secondary | ICD-10-CM

## 2014-04-16 NOTE — Progress Notes (Signed)
  Subjective:     Lindsey Fuller is a 29 y.o. MW 75P1 female who presents for a postpartum visit. She is 6 weeks postpartum following a spontaneous vaginal delivery. I have fully reviewed the prenatal and intrapartum course. The delivery was at 39 gestational weeks. Outcome: spontaneous vaginal delivery. Anesthesia: epidural. Postpartum course has been normal. Baby's course has been normal. Baby is feeding by breast. Bleeding no bleeding. Bowel function is normal. Bladder function is normal. Patient is not sexually active. Contraception method is condoms. Postpartum depression screening: negative. She feels some stress as a first time mom. Denies depression, SI and HI.  The following portions of the patient's history were reviewed and updated as appropriate: allergies, current medications, past family history, past medical history, past social history, past surgical history and problem list.  Review of Systems Pertinent items are noted in HPI.   Objective:    BP 122/71 mmHg  Pulse 54  Resp 16  Ht 5\' 6"  (1.676 m)  Wt 149 lb (67.586 kg)  BMI 24.06 kg/m2  Breastfeeding? Yes  General:  alert   Breasts:  inspection negative, no nipple discharge or bleeding, no masses or nodularity palpable  Lungs: clear to auscultation bilaterally  Heart:  regular rate and rhythm, S1, S2 normal, no murmur, click, rub or gallop  Abdomen: soft, non-tender; bowel sounds normal; no masses,  no organomegaly   Vulva:  normal  Vagina: normal vagina  Cervix:  anteverted  Corpus: normal  Adnexa:  no mass, fullness, tenderness  Rectal Exam: Not performed.        Assessment:    Normal  postpartum exam. Pap smear done at today's visit. She had ASCUS+ HR HPV 12/14.  Plan:    1. Contraception: condoms 2. Follow up in: 1 year or as needed.

## 2014-04-18 LAB — CYTOLOGY - PAP

## 2014-12-20 ENCOUNTER — Ambulatory Visit: Payer: BLUE CROSS/BLUE SHIELD | Admitting: Family Medicine

## 2015-03-04 IMAGING — US US OB COMP +14 WK
1 series · 12 of 28 positions shown · non-contrast
Comparison: none

OBSTETRICS REPORT

Service(s) Provided
 US OB COMP + 14 WK                                    76805.1
Indications
 19 weeks gestation of pregnancy
 Basic anatomic survey                                 [id number]
Fetal Evaluation
 Num Of Fetuses:    1
 Fetal Heart Rate:  143                          bpm
 Cardiac Activity:  Observed
 Presentation:      Cephalic
 Placenta:          Posterior, above cervical
                    os
 P. Cord            Marginal insertion
 Insertion:
 Comment:    PLACENTAL CORD INSERT 1.5 CM FROM SUPERIOR
             BORDER
 Amniotic Fluid
 AFI FV:      Subjectively within normal limits
                                             Larg Pckt:    7.19  cm
Biometry
 BPD:     49.1  mm     G. Age:  20w 6d                CI:         83.4   70 - 86
 OFD:     58.9  mm                                    FL/HC:      17.8   16.1 -
 HC:       176  mm     G. Age:  20w 1d       79  %    HC/AC:      1.12   1.09 -
 AC:     157.1  mm     G. Age:  20w 6d       89  %    FL/BPD:
 FL:      31.3  mm     G. Age:  19w 5d       58  %    FL/AC:      19.9   20 - 24
 HUM:     31.8  mm     G. Age:  20w 4d       86  %
 CER:     19.5  mm     G. Age:  18w 5d       37  %
 NFT:     4.15  mm
 Est. FW:     347  gm    0 lb 12 oz      61  %
Gestational Age
 LMP:           19w 2d        Date:  06/03/13                 EDD:   03/10/14
 U/S Today:     20w 3d                                        EDD:   03/02/14
 Best:          19w 2d     Det. By:  LMP  (06/03/13)          EDD:   03/10/14
Anatomy
 Cranium:          Appears normal         Aortic Arch:      Appears normal
 Fetal Cavum:      Appears normal         Ductal Arch:      Appears normal
 Ventricles:       Appears normal         Diaphragm:        Appears normal
 Choroid Plexus:   Appears normal         Stomach:          Appears normal, left
                                                            sided
 Cerebellum:       Appears normal         Abdomen:          Appears normal
 Posterior Fossa:  Appears normal         Abdominal Wall:   Appears nml (cord
                                                            insert, abd wall)
 Nuchal Fold:      Appears normal         Cord Vessels:     Appears normal (3
                                                            vessel cord)
 Face:             Appears normal         Kidneys:          Appear normal
                   (orbits and profile)
 Lips:             Appears normal         Bladder:          Appears normal
 Palate:           Not well visualized    Spine:            Appears normal
 Heart:            Appears normal         Lower             Appears normal
                   (4CH, axis, and        Extremities:
                   situs)
 RVOT:             Appears normal         Upper             Appears normal
                                          Extremities:
 LVOT:             Appears normal
 Other:  Fetus appears to be a male. Heels and 5th digit visualized.
Targeted Anatomy
 Fetal Central Nervous System
 Lat. Ventricles:  6.2                    Cisterna Magna:
Cervix Uterus Adnexa
 Cervical Length:    4.13     cm
 Cervix:       Normal appearance by transabdominal scan.
 Uterus:       No abnormality visualized.
 Cul De Sac:   No free fluid seen.
 Left Ovary:    No adnexal mass visualized.
 Right Ovary:   No adnexal mass visualized.
 Adnexa:     No adnexal mass visualized.
Impression
INDICATION: 28 yr old G1P0 at 09w1d for fetal anatomic survey.
 Remote read.

[Series 1: us ob comp +14 wk mfm · 110 acquisitions, 12 frames shown]
[im 5/110]
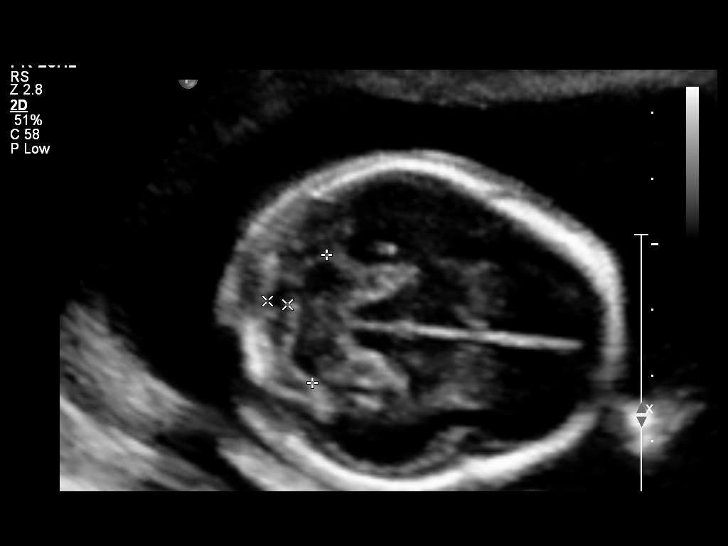
[im 13/110]
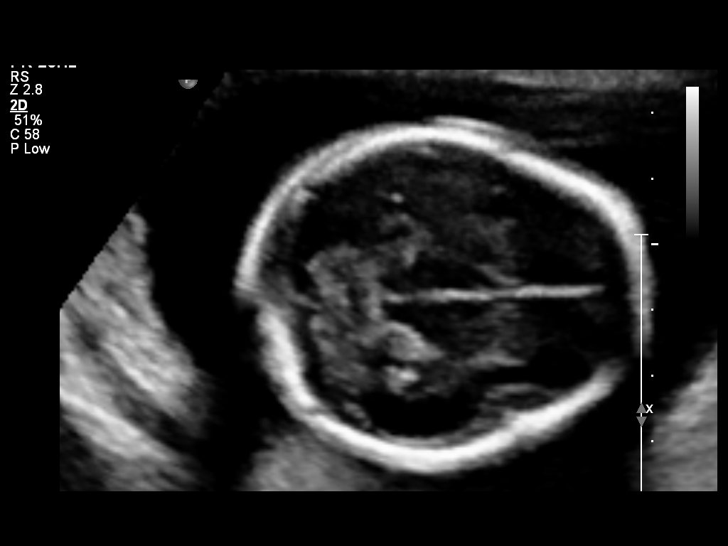
[im 21/110]
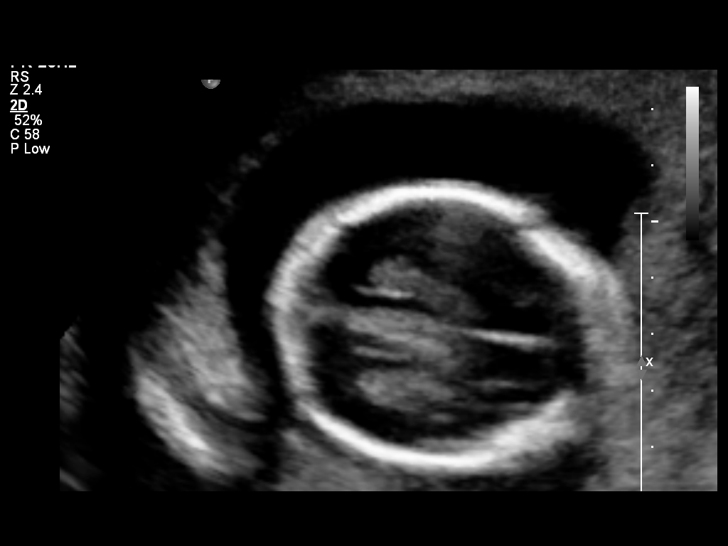
[im 33/110]
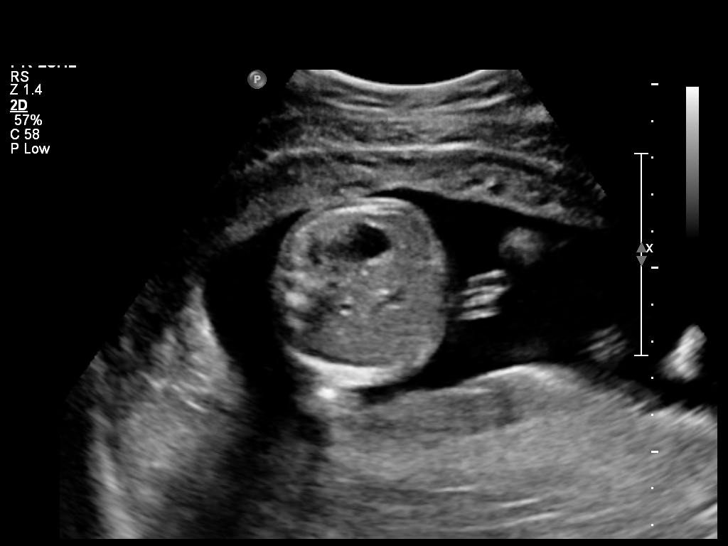
[im 41/110]
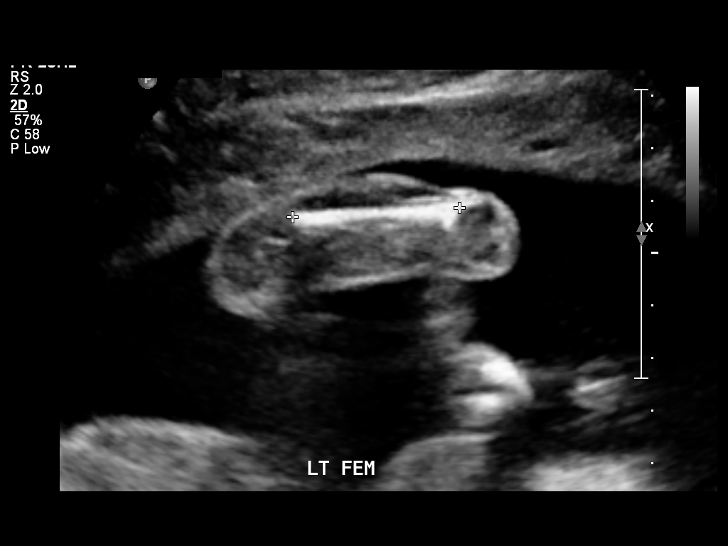
[im 49/110]
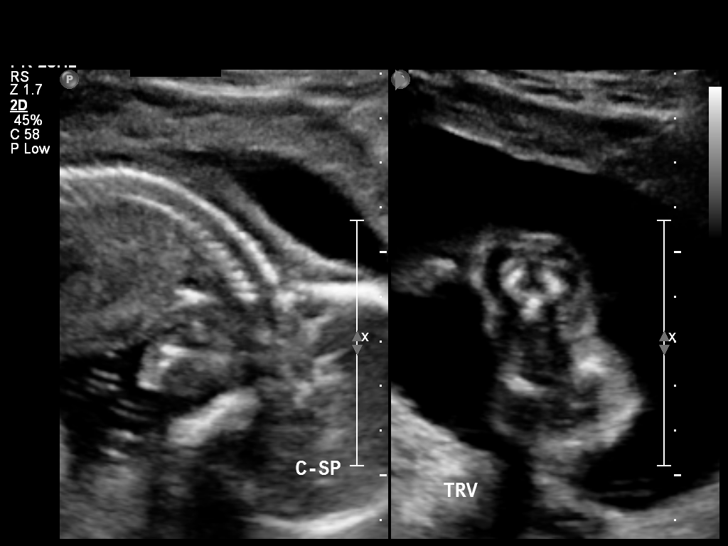
[im 61/110]
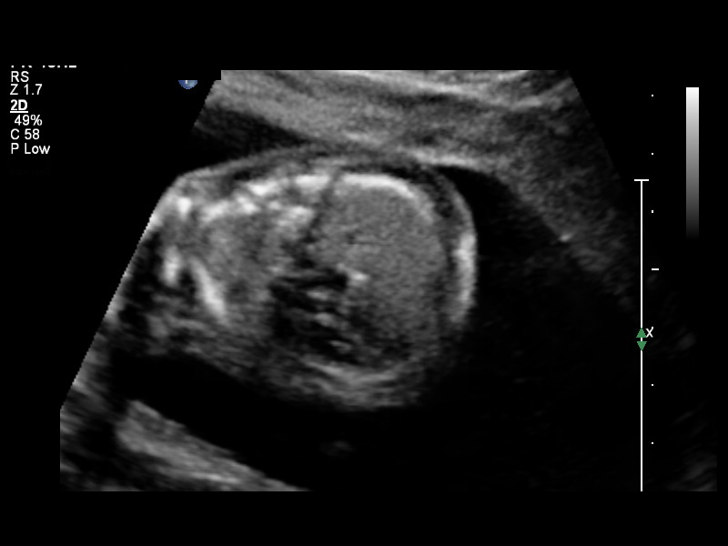
[im 69/110]
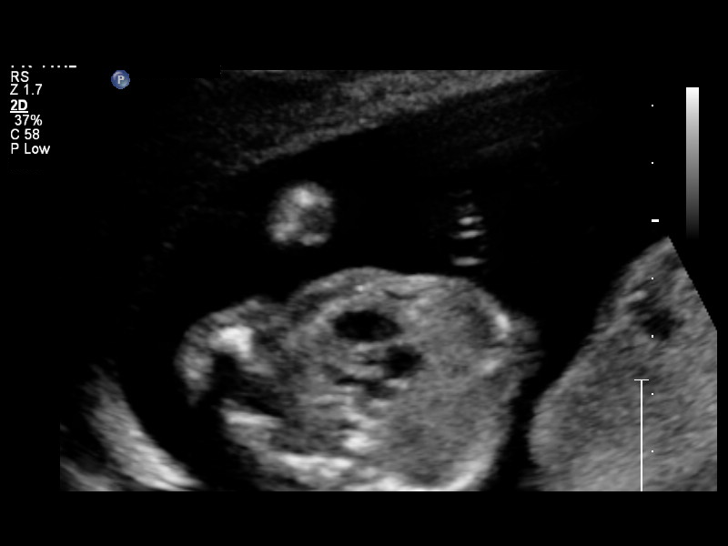
[im 77/110]
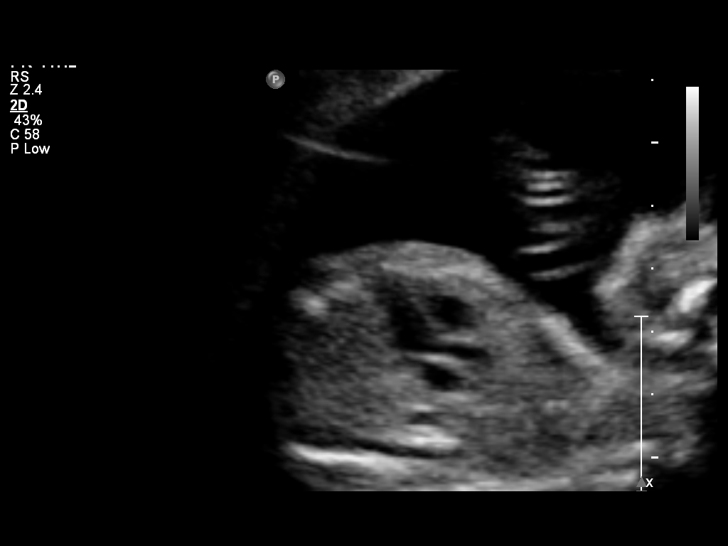
[im 89/110]
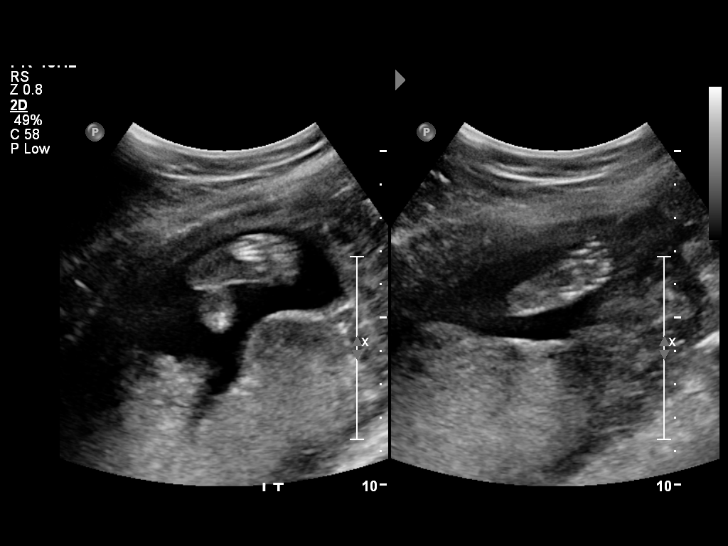
[im 97/110]
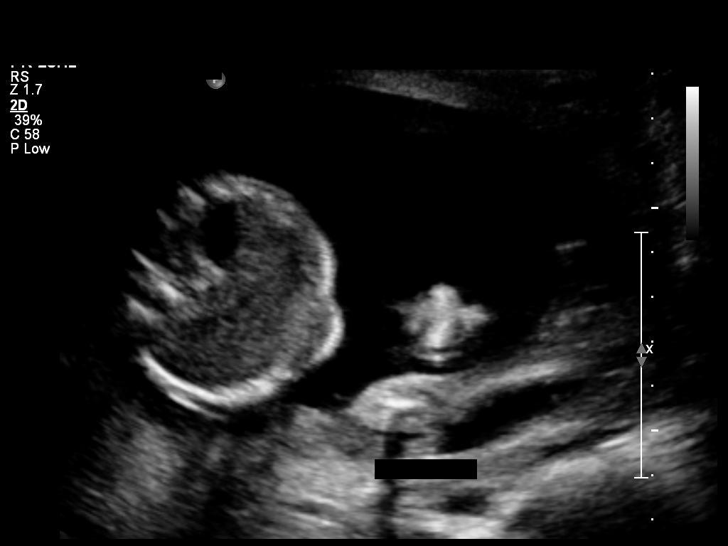
[im 105/110]
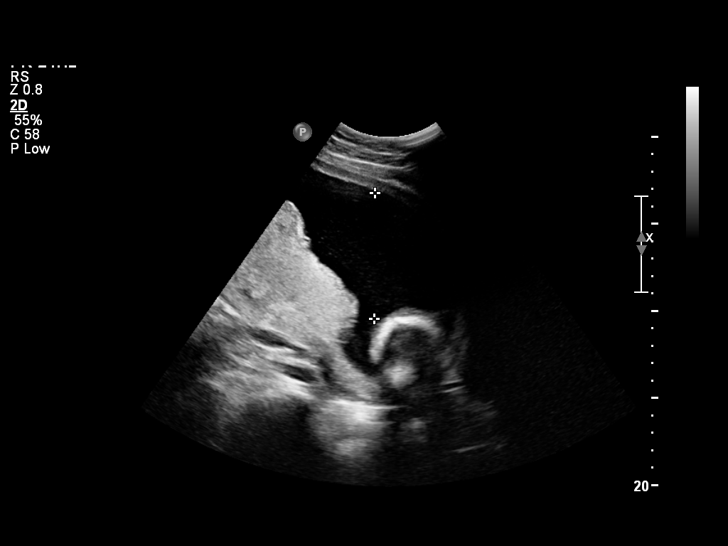

[12 of 28 positions shown; findings below may reference images not displayed]

FINDINGS: 1. Single intrauterine pregnancy.
 2. Fetal biometry is consistent with dating; fetus measures 8
 days ahead.
 3. Posterior placenta without evidence of previa. There is a
 marginal placental cord insertion.
 4. Normal amniotic fluid volume.
 5. Normal transabdominal cervical length.
 6. Normal fetal anatomic survey.
Recommendations

 1. Appropriate fetal growth.
 2. Normal fetal anatomic survey.
 3. Normal quad screen.
 4. Marginal placental cord insertion:
 - no further surveillance indicated
 5. Recommend follow up ultrasounds as clinically indicated.

 questions or concerns.

## 2023-07-20 ENCOUNTER — Ambulatory Visit: Admitting: Family Medicine

## 2023-07-20 ENCOUNTER — Encounter: Payer: Self-pay | Admitting: Family Medicine

## 2023-07-20 VITALS — BP 100/70 | HR 75 | Temp 98.1°F | Resp 18 | Ht 66.0 in | Wt 149.8 lb

## 2023-07-20 DIAGNOSIS — J4541 Moderate persistent asthma with (acute) exacerbation: Secondary | ICD-10-CM

## 2023-07-20 MED ORDER — IPRATROPIUM-ALBUTEROL 0.5-2.5 (3) MG/3ML IN SOLN
3.0000 mL | Freq: Once | RESPIRATORY_TRACT | Status: AC
  Administered 2023-07-20: 3 mL via RESPIRATORY_TRACT

## 2023-07-20 MED ORDER — BUDESONIDE-FORMOTEROL FUMARATE 80-4.5 MCG/ACT IN AERO: 2.0000 | INHALATION_SPRAY | Freq: Two times a day (BID) | RESPIRATORY_TRACT | 3 refills | Status: AC

## 2023-07-20 NOTE — Progress Notes (Signed)
 Established Patient Office Visit  Subjective   Patient ID: Lindsey Fuller, female    DOB: 05/17/1985  Age: 38 y.o. MRN: [id number]  Chief Complaint  Patient presents with   New Patient (Initial Visit)    Pt states having SOB since June and states off and on. Pt seen at [facility] and was given pred and inhaler. Pt has appt with allergy upcoming.     HPI Discussed the use of AI scribe software for clinical note transcription with the patient, who gave verbal consent to proceed.  History of Present Illness Lindsey Fuller is a 38 year old female with a history of allergies who presents with shortness of breath.  She has been experiencing shortness of breath over the past few days, a recurring issue since June. Initially, she had shortness of breath for about three weeks in June, which improved but has recently worsened. She has difficulty completing a full breath, and exertion, such as running with her children, exacerbates her symptoms, while lying down provides some relief. She has tried natural remedies like honey and is currently taking Claritin, which has not been very effective.  In June, she visited urgent care twice and a mini clinic, where she was prescribed prednisone and albuterol . These treatments provided temporary relief, but symptoms returned after a few days. She dislikes the side effects of albuterol , which include fatigue, impacting her ability to care for her three children at home.  She suspects that exposure to strong fragrances, such as air fresheners in her husband's office and perfumes, may have triggered her symptoms. Additionally, her father's visit, who is a smoker, may have contributed to her symptoms despite him not smoking indoors.  She has a family history of asthma, as her grandmother had the condition. She is allergic to dust, mold, trees, pollen, smoke, and animals like cats and dogs. She has been using albuterol  as needed, with the last use being the  previous night at 1:30 AM.  No recent chest x-ray. No current symptoms of asthma in her children.   Patient Active Problem List   Diagnosis Date Noted   NSVD (normal spontaneous vaginal delivery) 03/07/2014   Premature rupture of membranes 03/04/2014   Atypical squamous cell changes of undetermined significance (ASCUS) on cervical cytology with positive high risk human papilloma virus (HPV) 07/24/2013   Supervision of normal pregnancy in first trimester 07/24/2013   Seasonal allergies 04/06/2012   Nasal congestion 04/06/2012   Past Medical History:  Diagnosis Date   Abnormal Pap smear of cervix    Allergy    Anemia    Weakness    Past Surgical History:  Procedure Laterality Date   NO PAST SURGERIES     Social History   Tobacco Use   Smoking status: Never   Smokeless tobacco: Never  Substance Use Topics   Alcohol use: No   Drug use: No   Social History   Socioeconomic History   Marital status: Married    Spouse name: Daly Whipkey   Number of children: Not on file   Years of education: BA   Highest education level: Master's degree (e.g., MA, MS, MEng, MEd, MSW, MBA)  Occupational History   Occupation: Student  Tobacco Use   Smoking status: Never   Smokeless tobacco: Never  Substance and Sexual Activity   Alcohol use: No   Drug use: No   Sexual activity: Yes    Partners: Male    Comment: runs, goes to the gymn 3 X week,  student BS degree  Other Topics Concern   Not on file  Social History Narrative   2 caffeinated drinks daily.  Exercises regulalry.     Social Drivers of Corporate investment banker Strain: Low Risk  (07/19/2023)   Overall Financial Resource Strain (CARDIA)    Difficulty of Paying Living Expenses: Not hard at all  Food Insecurity: No Food Insecurity (07/19/2023)   Hunger Vital Sign    Worried About Running Out of Food in the Last Year: Never true    Ran Out of Food in the Last Year: Never true  Transportation Needs: No Transportation Needs  (07/19/2023)   PRAPARE - Administrator, Civil Service (Medical): No    Lack of Transportation (Non-Medical): No  Physical Activity: Sufficiently Active (07/19/2023)   Exercise Vital Sign    Days of Exercise per Week: 5 days    Minutes of Exercise per Session: 40 min  Stress: No Stress Concern Present (07/19/2023)   [employer] of Occupational Health - Occupational Stress Questionnaire    Feeling of Stress: Not at all  Social Connections: Socially Isolated (07/19/2023)   Social Connection and Isolation Panel    Frequency of Communication with Friends and Family: Once a week    Frequency of Social Gatherings with Friends and Family: Once a week    Attends Religious Services: Never    Database administrator or Organizations: No    Attends Engineer, structural: Not on file    Marital Status: Married  Intimate Partner Violence: Unknown (04/16/2021)   Received from [facility]   HITS    Physically Hurt: Not on file    Insult or Talk Down To: Not on file    Threaten Physical Harm: Not on file    Scream or Curse: Not on file   No family status information on file.   No family history on file. Allergies  Allergen Reactions   Aspirin Dermatitis   Asa Buff (Mag [Buffered Aspirin] Rash      Review of Systems  Constitutional:  Negative for fever and malaise/fatigue.  HENT:  Negative for congestion.   Eyes:  Negative for blurred vision.  Respiratory:  Positive for shortness of breath. Negative for wheezing.   Cardiovascular:  Positive for chest pain. Negative for palpitations and leg swelling.  Gastrointestinal:  Negative for abdominal pain, blood in stool and nausea.  Genitourinary:  Negative for dysuria and frequency.  Musculoskeletal:  Negative for falls.  Skin:  Negative for rash.  Neurological:  Negative for dizziness, loss of consciousness and headaches.  Endo/Heme/Allergies:  Negative for environmental allergies.  Psychiatric/Behavioral:  Negative for  depression. The patient is not nervous/anxious.       Objective:     BP 100/70 (BP Location: Left Arm, Patient Position: Sitting, Cuff Size: Normal)   Pulse 75   Temp 98.1 F (36.7 C) (Oral)   Resp 18   Ht 5' 6 (1.676 m)   Wt 149 lb 12.8 oz (67.9 kg)   LMP 06/29/2023   SpO2 99%   Breastfeeding No   BMI 24.18 kg/m  BP Readings from Last 3 Encounters:  07/20/23 100/70  04/16/14 122/71  03/22/14 120/69   Wt Readings from Last 3 Encounters:  07/20/23 149 lb 12.8 oz (67.9 kg)  04/16/14 149 lb (67.6 kg)  03/22/14 155 lb (70.3 kg)   SpO2 Readings from Last 3 Encounters:  07/20/23 99%  03/06/14 99%  02/22/14 98%  Physical Exam Vitals and nursing note reviewed.  Constitutional:      General: She is not in acute distress.    Appearance: Normal appearance. She is well-developed.  HENT:     Head: Normocephalic and atraumatic.  Eyes:     General: No scleral icterus.       Right eye: No discharge.        Left eye: No discharge.  Cardiovascular:     Rate and Rhythm: Normal rate and regular rhythm.     Heart sounds: No murmur heard. Pulmonary:     Effort: Pulmonary effort is normal. No respiratory distress.     Breath sounds: Decreased breath sounds present.     Comments: Duoneb given in office---- with good results  Musculoskeletal:        General: Normal range of motion.     Cervical back: Normal range of motion and neck supple.     Right lower leg: No edema.     Left lower leg: No edema.  Skin:    General: Skin is warm and dry.  Neurological:     Mental Status: She is alert and oriented to person, place, and time.  Psychiatric:        Mood and Affect: Mood normal.        Behavior: Behavior normal.        Thought Content: Thought content normal.        Judgment: Judgment normal.      No results found for any visits on 07/20/23.  Last CBC Lab Results  Component Value Date   WBC 16.1 (H) 03/05/2014   HGB 11.0 (L) 03/05/2014   HCT 32.6 (L) 03/05/2014    MCV 87.9 03/05/2014   MCH 29.6 03/05/2014   RDW 14.2 03/05/2014   PLT 132 (L) 03/05/2014   Last metabolic panel Lab Results  Component Value Date   GLUCOSE 127 (H) 12/14/2013   NA 135 12/14/2013   K 3.7 12/14/2013   CL 104 12/14/2013   CO2 19 12/14/2013   BUN 7 12/14/2013   CREATININE 0.47 (L) 12/14/2013   CALCIUM 8.6 12/14/2013   PROT 6.1 12/14/2013   ALBUMIN 3.1 (L) 12/14/2013   BILITOT 0.2 12/14/2013   ALKPHOS 52 12/14/2013   AST 19 12/14/2013   ALT 14 12/14/2013   Last lipids Lab Results  Component Value Date   CHOL 128 01/28/2012   HDL 47 01/28/2012   LDLCALC 70 01/28/2012   TRIG 54 01/28/2012   CHOLHDL 2.7 01/28/2012   Last hemoglobin A1c No results found for: HGBA1C Last thyroid functions Lab Results  Component Value Date   TSH 1.082 01/28/2012   Last vitamin D  Lab Results  Component Value Date   VD25OH 33 01/28/2012   Last vitamin B12 and Folate Lab Results  Component Value Date   VITAMINB12 617 01/28/2012      The ASCVD Risk score (Arnett DK, et al., 2019) failed to calculate for the following reasons:   The 2019 ASCVD risk score is only valid for ages 35 to 71    Assessment & Plan:   Problem List Items Addressed This Visit   None Visit Diagnoses       Moderate persistent asthma with acute exacerbation    -  Primary   Relevant Medications   budesonide -formoterol  (SYMBICORT ) 80-4.5 MCG/ACT inhaler   ipratropium-albuterol  (DUONEB) 0.5-2.5 (3) MG/3ML nebulizer solution 3 mL (Completed)   Other Relevant Orders   DG Chest 2 View  Assessment and Plan Assessment & Plan Asthma   She presents with shortness of breath persistent for the past few days, similar to an episode in June lasting three weeks. Symptoms improve when lying down and worsen with exertion. Allergies to strong fragrances and secondhand smoke are noted triggers. Previous treatments with prednisone and albuterol  provided temporary relief. Asthma is suspected, likely  triggered by environmental factors. Albuterol  causes jitteriness, which is problematic given her home responsibilities. A combination inhaler with a steroid is recommended to reduce side effects and provide consistent relief. Prescribe a combination inhaler with a steroid for twice-daily use. Keep albuterol  on hand for as-needed use. Administer a nebulizer treatment in the office to assess improvement in breathing. Educate on proper inhaler use, including rinsing the mouth after use to prevent thrush. Advise avoiding known triggers such as strong fragrances and secondhand smoke.  Allergic Rhinitis   She has long-standing allergies to dust, mold, pollen, smoke, and strong fragrances, contributing to respiratory symptoms and potentially exacerbating asthma. Environmental triggers significantly impact her symptoms. Follow up with an allergist on July 29 for further evaluation and management.   No follow-ups on file.    Dayna Geurts R Lowne Chase, DO

## 2023-10-04 ENCOUNTER — Ambulatory Visit (INDEPENDENT_AMBULATORY_CARE_PROVIDER_SITE_OTHER): Admitting: Family Medicine

## 2023-10-04 ENCOUNTER — Encounter: Payer: Self-pay | Admitting: Family Medicine

## 2023-10-04 ENCOUNTER — Ambulatory Visit: Payer: Self-pay

## 2023-10-04 VITALS — BP 105/67 | HR 64 | Temp 98.3°F | Ht 66.0 in | Wt 147.0 lb

## 2023-10-04 DIAGNOSIS — N3001 Acute cystitis with hematuria: Secondary | ICD-10-CM | POA: Diagnosis not present

## 2023-10-04 DIAGNOSIS — R300 Dysuria: Secondary | ICD-10-CM | POA: Insufficient documentation

## 2023-10-04 LAB — POCT URINALYSIS DIP (CLINITEK)
Glucose, UA: NEGATIVE mg/dL
Ketones, POC UA: NEGATIVE mg/dL
Leukocytes, UA: NEGATIVE
Nitrite, UA: NEGATIVE
POC PROTEIN,UA: NEGATIVE
Spec Grav, UA: 1.01 (ref 1.010–1.025)
Urobilinogen, UA: 0.2 U/dL
pH, UA: 5 (ref 5.0–8.0)

## 2023-10-04 MED ORDER — CIPROFLOXACIN HCL 500 MG PO TABS: 500.0000 mg | ORAL_TABLET | Freq: Two times a day (BID) | ORAL | 0 refills | Status: AC

## 2023-10-04 NOTE — Progress Notes (Signed)
 Acute Office Visit  Subjective:     Patient ID: Lindsey Fuller, female    DOB: 03/22/1985, 38 y.o.   MRN: [id number]  Chief Complaint  Patient presents with   Urinary Tract Infection    Frequent urination, chills, numbness, did virtual visit, said UTI and prescribed cephalaxin.  Did not work. Yesterday I started having a burning sensation and my feet have a tingling/numb sensation    HPI Patient is in today for ongoing UTI symptoms. She was prescribed Keflex  per virtual visit without resolution in symptoms. This is day 6 of 7 days of antibiotics. Symptoms last week: low abdominal pain, urgency, and burning. Burning improved first 2 days and has returned. Chills ,no fever.  Feet numbness once 1-2 weeks before UTI. No flank pain. This is the first time she has had UTI Urinates after intercourse.      ROS      Objective:    BP 105/67   Pulse 64   Temp 98.3 F (36.8 C) (Oral)   Ht 5' 6 (1.676 m)   Wt 147 lb (66.7 kg)   LMP 09/19/2023 (Exact Date)   SpO2 100%   BMI 23.73 kg/m    Physical Exam Vitals and nursing note reviewed.  Constitutional:      General: She is not in acute distress.    Appearance: Normal appearance. She is normal weight.  Cardiovascular:     Rate and Rhythm: Normal rate and regular rhythm.     Heart sounds: Normal heart sounds.  Pulmonary:     Effort: Pulmonary effort is normal.     Breath sounds: Normal breath sounds.  Abdominal:     General: Bowel sounds are normal.     Palpations: Abdomen is soft.     Tenderness: There is no abdominal tenderness. There is no right CVA tenderness or left CVA tenderness.  Skin:    General: Skin is warm and dry.  Neurological:     General: No focal deficit present.     Mental Status: She is alert. Mental status is at baseline.  Psychiatric:        Mood and Affect: Mood normal.        Behavior: Behavior normal.        Thought Content: Thought content normal.        Judgment: Judgment normal.      Results for orders placed or performed in visit on 10/04/23  POCT URINALYSIS DIP (CLINITEK)  Result Value Ref Range   Color, UA light yellow (A) yellow   Clarity, UA clear clear   Glucose, UA negative negative mg/dL   Bilirubin, UA moderate (A) negative   Ketones, POC UA negative negative mg/dL   Spec Grav, UA 8.989 8.989 - 1.025   Blood, UA moderate (A) negative   pH, UA 5.0 5.0 - 8.0   POC PROTEIN,UA negative negative, trace   Urobilinogen, UA 0.2 0.2 or 1.0 E.U./dL   Nitrite, UA Negative Negative   Leukocytes, UA Negative Negative        Assessment & Plan:   Problem List Items Addressed This Visit     Dysuria   Relevant Orders   POCT URINALYSIS DIP (CLINITEK) (Completed)   Urine Culture   Acute cystitis with hematuria - Primary   Symptoms have not resolved after 6 days of antibiotic treatment. Continues to have burning. No fever. No flank pain. Endorses chills. No abdominal tenderness today. No CVA tenderness.  POC urine: mod blood otherwise normal.  Will  send for culture and change antibiotic to Ciprofloxacin  500 mg BID for 3 days. Increase water intake. Follow-up with PCP for bilateral feet numbness as this is not a typical symptom of UTI.        Relevant Medications   ciprofloxacin  (CIPRO ) 500 MG tablet    Meds ordered this encounter  Medications   ciprofloxacin  (CIPRO ) 500 MG tablet    Sig: Take 1 tablet (500 mg total) by mouth 2 (two) times daily.    Dispense:  6 tablet    Refill:  0    Supervising Provider:   METHENEY, CATHERINE D [[id number]]  Agrees with plan of care discussed.  Questions answered.   Return if symptoms worsen or fail to improve.  Darice JONELLE Brownie, FNP

## 2023-10-04 NOTE — Telephone Encounter (Signed)
 Scheduled for same day alternate/regional location.   FYI Only or Action Required?: FYI only for provider.  Patient was last seen in primary care on 07/20/2023 by Antonio Meth, Jamee SAUNDERS, DO.  Called Nurse Triage reporting Urinary Tract Infection.  Symptoms began a week ago.  Interventions attempted: Prescription medications: cephalexin .  Symptoms are: gradually worsening.  Triage Disposition: See Physician Within 24 Hours  Patient/caregiver understands and will follow disposition?:  Reason for Disposition  [1] Taking antibiotic > 72 hours (3 days) for UTI AND [2] painful urination or frequency is SAME (unchanged, not better)  Answer Assessment - Initial Assessment Questions States UTI not improving on cephalexin  500 mg BID. Is on day 6 out of 7. States feet are also cold.   1. MAIN SYMPTOM: What is the main symptom you are concerned about? (e.g., painful urination, urine frequency)     Increased frequency, lower abdominal pressure  2. BETTER-SAME-WORSE: Are you getting better, staying the same, or getting worse compared to how you felt at your last visit to the doctor (most recent medical visit)?     Worse  3. PAIN: How bad is the pain?  (e.g., Scale 1-10; mild, moderate, or severe)     Lower abdominal pressure  4. FEVER: Do you have a fever? If Yes, ask: What is it, how was it measured, and when did it start?     Denies  5. OTHER SYMPTOMS: Do you have any other symptoms? (e.g., blood in the urine, flank pain, vaginal discharge)     Denies  6. DIAGNOSIS: When was the UTI diagnosed? By whom? Was it a kidney infection, bladder infection or both?     6 days ago  7. ANTIBIOTIC: What antibiotic(s) are you taking? How many times per day?     Cephalexin   Protocols used: Urinary Tract Infection on Antibiotic Follow-up Call - [facility] Copied from CRM [id number]. Topic: Clinical - Red Word Triage >> Oct 04, 2023 10:49 AM Farrel B wrote: [facility] that prompted  transfer to Nurse Triage: Patient is calling in regards to a UTI, she stated it started last week she had a virtual visit with a pharmacy doctor was given antibiotics and a week later its not worse

## 2023-10-04 NOTE — Assessment & Plan Note (Signed)
 Symptoms have not resolved after 6 days of antibiotic treatment. Continues to have burning. No fever. No flank pain. Endorses chills. No abdominal tenderness today. No CVA tenderness.  POC urine: mod blood otherwise normal.  Will send for culture and change antibiotic to Ciprofloxacin  500 mg BID for 3 days. Increase water intake. Follow-up with PCP for bilateral feet numbness as this is not a typical symptom of UTI.

## 2023-10-06 ENCOUNTER — Ambulatory Visit: Payer: Self-pay | Admitting: Family Medicine

## 2023-10-06 LAB — URINE CULTURE: Organism ID, Bacteria: NO GROWTH

## 2023-10-19 ENCOUNTER — Ambulatory Visit: Payer: Self-pay

## 2023-10-19 NOTE — Telephone Encounter (Signed)
 Appt scheduled

## 2023-10-19 NOTE — Telephone Encounter (Signed)
 FYI Only or Action Required?: FYI only for provider.  Patient was last seen in primary care on 10/04/2023 by Booker Darice SAUNDERS, FNP.  Called Nurse Triage reporting Dysuria.  Symptoms began several days ago.  Interventions attempted: Rest, hydration, or home remedies.  Symptoms are: gradually worsening.  Triage Disposition: See Physician Within 24 Hours  Patient/caregiver understands and will follow disposition?: Yes Patient requested OV at [facility] since it is closer to her commute to work. Appt scheduled for 10/8 at 0840  Copied from CRM #[id number]. Topic: Clinical - Red Word Triage >> Oct 19, 2023  9:55 AM Berwyn MATSU wrote: Red Word that prompted transfer to Nurse Triage: uti symptoms getting worse came in 10/04/23 and symptoms not better. Reason for Disposition  Urinating more frequently than usual (i.e., frequency) OR new-onset of the feeling of an urgent need to urinate (i.e., urgency)  Answer Assessment - Initial Assessment Questions 1. SYMPTOM: What's the main symptom you're concerned about? (e.g., frequency, incontinence)     Burning with urination   2. ONSET: When did the  symptoms  start?     Last Friday, 4 days ago  3. PAIN: Is there any pain? If Yes, ask: How bad is it? (Scale: 1-10; mild, moderate, severe)     *No Answer* 4. CAUSE: What do you think is causing the symptoms?     UTI  5. OTHER SYMPTOMS: Do you have any other symptoms? (e.g., blood in urine, fever, flank pain, pain with urination)     Fatigue, pain with urination, and lower abd pain   6. PREGNANCY: Is there any chance you are pregnant? When was your last menstrual period?     Lmp-This month  Protocols used: Urinary Symptoms-A-AH

## 2023-10-20 ENCOUNTER — Ambulatory Visit (INDEPENDENT_AMBULATORY_CARE_PROVIDER_SITE_OTHER): Admitting: Urgent Care

## 2023-10-20 ENCOUNTER — Encounter: Payer: Self-pay | Admitting: Urgent Care

## 2023-10-20 ENCOUNTER — Ambulatory Visit: Payer: Self-pay | Admitting: Urgent Care

## 2023-10-20 ENCOUNTER — Ambulatory Visit

## 2023-10-20 VITALS — BP 98/61 | HR 65 | Ht 66.0 in | Wt 148.0 lb

## 2023-10-20 DIAGNOSIS — R1024 Suprapubic pain: Secondary | ICD-10-CM | POA: Diagnosis not present

## 2023-10-20 DIAGNOSIS — R3129 Other microscopic hematuria: Secondary | ICD-10-CM | POA: Diagnosis not present

## 2023-10-20 DIAGNOSIS — R300 Dysuria: Secondary | ICD-10-CM

## 2023-10-20 DIAGNOSIS — M545 Low back pain, unspecified: Secondary | ICD-10-CM | POA: Diagnosis not present

## 2023-10-20 LAB — POCT URINALYSIS DIP (CLINITEK)
Bilirubin, UA: NEGATIVE
Glucose, UA: NEGATIVE mg/dL
Ketones, POC UA: NEGATIVE mg/dL
Leukocytes, UA: NEGATIVE
Nitrite, UA: NEGATIVE
POC PROTEIN,UA: NEGATIVE
Spec Grav, UA: 1.02 (ref 1.010–1.025)
Urobilinogen, UA: 0.2 U/dL
pH, UA: 6 (ref 5.0–8.0)

## 2023-10-20 MED ORDER — KETOROLAC TROMETHAMINE 10 MG PO TABS: 10.0000 mg | ORAL_TABLET | Freq: Three times a day (TID) | ORAL | 0 refills | Status: AC | PRN

## 2023-10-20 MED ORDER — TAMSULOSIN HCL 0.4 MG PO CAPS
0.4000 mg | ORAL_CAPSULE | Freq: Every day | ORAL | 0 refills | Status: AC
Start: 2023-10-20 — End: ?

## 2023-10-20 NOTE — Telephone Encounter (Signed)
 Copied from CRM [id number]. Topic: Clinical - Medical Advice >> Oct 20, 2023 11:30 AM Turkey B wrote: Reason for CRM: Patient called in has info that her kidneys are fine for the lab report, she wants to know shuld she still take the meds prescribed

## 2023-10-20 NOTE — Progress Notes (Signed)
 Established Patient Office Visit  Subjective:  Patient ID: Lindsey Fuller, female    DOB: March 06, 1985  Age: 38 y.o. MRN: [id number]  Chief Complaint  Patient presents with   Dysuria   Urinary Frequency    HPI  Discussed the use of AI scribe software for clinical note transcription with the patient, who gave verbal consent to proceed.  History of Present Illness   Lindsey Fuller is a 38 year old female who presents with recurrent urinary symptoms and abdominal pain.  Her symptoms began on September 22nd with dysuria, urinary frequency, and nocturia. She also experienced chills and suprapubic pressure pain, feeling as if her bladder was going to prolapse.  She was initially treated with a three-day course of ciprofloxacin , which temporarily resolved her symptoms. However, four to five days after completing the antibiotic course, coinciding with the fourth day of her menstrual period, her symptoms recurred.  The burning sensation is constant and not limited to urination. She denies any visible skin changes. She also reports significant fatigue and severe right-sided back pain, similar to menstrual pain.  No history of kidney stones. A previous urine culture showed no bacterial growth.       Patient Active Problem List   Diagnosis Date Noted   Dysuria 10/04/2023   Acute cystitis with hematuria 10/04/2023   NSVD (normal spontaneous vaginal delivery) 03/07/2014   Premature rupture of membranes 03/04/2014   Atypical squamous cell changes of undetermined significance (ASCUS) on cervical cytology with positive high risk human papilloma virus (HPV) 07/24/2013   Supervision of normal pregnancy in first trimester 07/24/2013   Seasonal allergies 04/06/2012   Nasal congestion 04/06/2012   Past Medical History:  Diagnosis Date   Abnormal Pap smear of cervix    Allergy    Anemia    Weakness    Past Surgical History:  Procedure Laterality Date   NO PAST SURGERIES     Social History    Tobacco Use   Smoking status: Never   Smokeless tobacco: Never  Substance Use Topics   Alcohol use: No   Drug use: No      ROS: as noted in HPI  Objective:     BP 98/61   Pulse 65   Ht 5' 6 (1.676 m)   Wt 148 lb (67.1 kg)   LMP 09/19/2023 (Exact Date)   SpO2 98%   BMI 23.89 kg/m  BP Readings from Last 3 Encounters:  10/20/23 98/61  10/04/23 105/67  07/20/23 100/70   Wt Readings from Last 3 Encounters:  10/20/23 148 lb (67.1 kg)  10/04/23 147 lb (66.7 kg)  07/20/23 149 lb 12.8 oz (67.9 kg)      Physical Exam Vitals and nursing note reviewed. Exam conducted with a chaperone present.  Constitutional:      General: She is not in acute distress.    Appearance: Normal appearance. She is normal weight. She is not ill-appearing, toxic-appearing or diaphoretic.  HENT:     Head: Normocephalic.  Cardiovascular:     Rate and Rhythm: Normal rate.  Pulmonary:     Effort: Pulmonary effort is normal. No respiratory distress.  Abdominal:     General: Abdomen is flat. Bowel sounds are normal. There is no distension.     Palpations: Abdomen is soft. There is no mass.     Tenderness: There is abdominal tenderness (minimal suprapubic). There is no right CVA tenderness, left CVA tenderness, guarding or rebound.     Hernia: No hernia is present.  Genitourinary:    Pubic Area: No rash or pubic lice.      Labia:        Right: No rash, tenderness, lesion or injury.        Left: No rash, tenderness, lesion or injury.      Urethra: No prolapse, urethral pain, urethral swelling or urethral lesion.     Vagina: Normal. No vaginal discharge or lesions.     Cervix: Normal. No cervical motion tenderness, discharge, friability or lesion.     Uterus: Normal.      Adnexa: Right adnexa normal and left adnexa normal.       Right: No mass, tenderness or fullness.         Left: No mass, tenderness or fullness.       Rectum: Normal.  Musculoskeletal:       Back:     Right lower leg: No  edema.     Left lower leg: No edema.  Lymphadenopathy:     Lower Body: No right inguinal adenopathy. No left inguinal adenopathy.  Skin:    General: Skin is warm and dry.     Coloration: Skin is not jaundiced.     Findings: No bruising, erythema or rash.  Neurological:     General: No focal deficit present.     Mental Status: She is alert and oriented to person, place, and time.      Results for orders placed or performed in visit on 10/20/23  POCT URINALYSIS DIP (CLINITEK)  Result Value Ref Range   Color, UA yellow yellow   Clarity, UA clear clear   Glucose, UA negative negative mg/dL   Bilirubin, UA negative negative   Ketones, POC UA negative negative mg/dL   Spec Grav, UA 8.979 8.989 - 1.025   Blood, UA moderate (A) negative   pH, UA 6.0 5.0 - 8.0   POC PROTEIN,UA negative negative, trace   Urobilinogen, UA 0.2 0.2 or 1.0 E.U./dL   Nitrite, UA Negative Negative   Leukocytes, UA Negative Negative      The ASCVD Risk score (Arnett DK, et al., 2019) failed to calculate for the following reasons:   The 2019 ASCVD risk score is only valid for ages 85 to 48  Assessment & Plan:  Dysuria -     POCT URINALYSIS DIP (CLINITEK) -     Urine Microscopic -     DG Abd 1 View; Future -     Ketorolac Tromethamine; Take 1 tablet (10 mg total) by mouth every 8 (eight) hours as needed.  Dispense: 12 tablet; Refill: 0 -     Tamsulosin HCl; Take 1 capsule (0.4 mg total) by mouth daily.  Dispense: 14 capsule; Refill: 0  Other microscopic hematuria -     Urine Microscopic -     DG Abd 1 View; Future -     Ketorolac Tromethamine; Take 1 tablet (10 mg total) by mouth every 8 (eight) hours as needed.  Dispense: 12 tablet; Refill: 0 -     Tamsulosin HCl; Take 1 capsule (0.4 mg total) by mouth daily.  Dispense: 14 capsule; Refill: 0  Suprapubic pain -     Ketorolac Tromethamine; Take 1 tablet (10 mg total) by mouth every 8 (eight) hours as needed.  Dispense: 12 tablet; Refill: 0 -      Tamsulosin HCl; Take 1 capsule (0.4 mg total) by mouth daily.  Dispense: 14 capsule; Refill: 0  Acute right-sided low back pain without sciatica -  Ketorolac Tromethamine; Take 1 tablet (10 mg total) by mouth every 8 (eight) hours as needed.  Dispense: 12 tablet; Refill: 0 -     Tamsulosin HCl; Take 1 capsule (0.4 mg total) by mouth daily.  Dispense: 14 capsule; Refill: 0   Assessment and Plan    Dysuria Dysuria with burning and frequency. Initial ciprofloxacin  provided temporary relief. Symptoms recurred with menstruation. Urine culture negative, suggesting non-bacterial cause. Consider kidney stones due to back pain. - Performed pelvic exam which was normal; no rash or lesions. - Reviewed urine sample; neg for leuks and nitrates - send out urine microscopic to assess for crystals  Hematuria with R back pain Clinically suspect ureterolithiasis.  - obtain Kub xray - start flomax and ketorolac - urine micro      No follow-ups on file.   Benton LITTIE Gave, PA

## 2023-10-20 NOTE — Patient Instructions (Addendum)
 Your urine sample shows no evidence of a UTI. There is blood in your urine.  I will send out your urine for a microscopic evaluation. This will test for possible crystals.  Please go to [address] to obtain an xray of the abdomen looking for possible kidney stone.  Please increase your water intake. I have called in flomax and ketorlac to help with your symptoms. Take flomax once daily for 7-14 days. Take ketorolac every 8 hours as needed for the next 4 days.

## 2023-10-21 ENCOUNTER — Telehealth: Payer: Self-pay

## 2023-10-21 DIAGNOSIS — R3129 Other microscopic hematuria: Secondary | ICD-10-CM

## 2023-10-21 DIAGNOSIS — M545 Low back pain, unspecified: Secondary | ICD-10-CM

## 2023-10-21 DIAGNOSIS — R1031 Right lower quadrant pain: Secondary | ICD-10-CM

## 2023-10-21 LAB — URINALYSIS, MICROSCOPIC ONLY
Bacteria, UA: NONE SEEN
Casts: NONE SEEN /LPF
WBC, UA: NONE SEEN /HPF (ref 0–5)

## 2023-10-21 NOTE — Telephone Encounter (Signed)
 Copied from CRM [id number]. Topic: General - Other >> Oct 21, 2023  2:29 PM Lindsey Fuller wrote: Reason for CRM: patient is requesting a order to get a ultrasound. She was told by whitney crain to follow up with PCP but her pcp doesn't offer imaging in office. She would like a call in regards to this matter. Thanks.

## 2023-10-25 MED ORDER — PHENAZOPYRIDINE HCL 100 MG PO TABS: 100.0000 mg | ORAL_TABLET | Freq: Three times a day (TID) | ORAL | 0 refills | Status: AC | PRN

## 2023-10-27 ENCOUNTER — Ambulatory Visit: Payer: Self-pay | Admitting: Urgent Care

## 2023-10-27 ENCOUNTER — Ambulatory Visit (INDEPENDENT_AMBULATORY_CARE_PROVIDER_SITE_OTHER)

## 2023-10-27 DIAGNOSIS — R1031 Right lower quadrant pain: Secondary | ICD-10-CM

## 2023-10-27 DIAGNOSIS — R1024 Suprapubic pain: Secondary | ICD-10-CM

## 2023-10-27 DIAGNOSIS — R3129 Other microscopic hematuria: Secondary | ICD-10-CM

## 2023-10-27 DIAGNOSIS — R10A0 Flank pain, unspecified side: Secondary | ICD-10-CM | POA: Diagnosis not present

## 2023-10-27 DIAGNOSIS — R300 Dysuria: Secondary | ICD-10-CM

## 2023-10-27 DIAGNOSIS — M545 Low back pain, unspecified: Secondary | ICD-10-CM

## 2023-10-28 ENCOUNTER — Ambulatory Visit: Admitting: Family Medicine

## 2023-11-21 NOTE — Progress Notes (Incomplete)
    Chief Complaint: No chief complaint on file.   History of Present Illness:  Lindsey Fuller is a 38 y.o. female who is seen in consultation from [place], [place], [place] for evaluation of ***.   Past Medical History:  Past Medical History:  Diagnosis Date   Abnormal Pap smear of cervix    Allergy    Anemia    Weakness     Past Surgical History:  Past Surgical History:  Procedure Laterality Date   NO PAST SURGERIES      Allergies:  Allergies  Allergen Reactions   Aspirin Dermatitis   Asa Buff (Mag [Buffered Aspirin] Rash    Family History:  No family history on file.  Social History:  Social History   Tobacco Use   Smoking status: Never   Smokeless tobacco: Never  Substance Use Topics   Alcohol use: No   Drug use: No    Review of symptoms:  Constitutional:  Negative for unexplained weight loss, night sweats, fever, chills ENT:  Negative for nose bleeds, sinus pain, painful swallowing CV:  Negative for chest pain, shortness of breath, exercise intolerance, palpitations, loss of consciousness Resp:  Negative for cough, wheezing, shortness of breath GI:  Negative for nausea, vomiting, diarrhea, bloody stools GU:  Positives noted in HPI; otherwise negative for gross hematuria, dysuria, urinary incontinence Neuro:  Negative for seizures, poor balance, limb weakness, slurred speech Psych:  Negative for lack of energy, depression, anxiety Endocrine:  Negative for polydipsia, polyuria, symptoms of hypoglycemia (dizziness, hunger, sweating) Hematologic:  Negative for anemia, purpura, petechia, prolonged or excessive bleeding, use of anticoagulants  Allergic:  Negative for difficulty breathing or choking as a result of exposure to anything; no shellfish allergy; no allergic response (rash/itch) to materials, foods  Physical exam: There were no vitals taken for this visit. GENERAL APPEARANCE:  Well appearing, well developed, well nourished, NAD HEENT: Atraumatic,  Normocephalic. NECK: Normal appearance LUNGS: Normal inspiratory and expiratory excursion HEART: Regular Rate ABDOMEN: *** EXTREMITIES: Moves all extremities well.  Without clubbing, cyanosis, or edema. NEUROLOGIC:  Alert and oriented x 3, normal gait, CN II-XII grossly intact.  MENTAL STATUS:  Appropriate. SKIN:  Warm, dry and intact.    Results: No results found for this or any previous visit (from the past 24 hours).  I have reviewed prior patient's records  I have reviewed referring/prior physicians records  I have reviewed urinalysis  I have reviewed prior urine cultures  I reviewed prior imaging studies  Assessment: No diagnosis found.   Plan: ***

## 2023-11-22 ENCOUNTER — Ambulatory Visit: Admitting: Urology

## 2023-11-22 DIAGNOSIS — R9341 Abnormal radiologic findings on diagnostic imaging of renal pelvis, ureter, or bladder: Secondary | ICD-10-CM
